# Patient Record
Sex: Female | Born: 1995 | Race: White | Hispanic: No | Smoking: Former smoker
Health system: Southern US, Community
[De-identification: ages and names within clinical notes are randomized; demographics above are authoritative.]

## PROBLEM LIST (undated history)

## (undated) ENCOUNTER — Inpatient Hospital Stay: Payer: Self-pay

## (undated) ENCOUNTER — Ambulatory Visit: Admission: EM | Payer: Medicaid Other

## (undated) DIAGNOSIS — A498 Other bacterial infections of unspecified site: Secondary | ICD-10-CM

## (undated) DIAGNOSIS — D509 Iron deficiency anemia, unspecified: Secondary | ICD-10-CM

## (undated) DIAGNOSIS — O351XX0 Maternal care for (suspected) chromosomal abnormality in fetus, not applicable or unspecified: Secondary | ICD-10-CM

## (undated) DIAGNOSIS — E669 Obesity, unspecified: Secondary | ICD-10-CM

## (undated) DIAGNOSIS — F419 Anxiety disorder, unspecified: Secondary | ICD-10-CM

## (undated) DIAGNOSIS — S32599A Other specified fracture of unspecified pubis, initial encounter for closed fracture: Secondary | ICD-10-CM

## (undated) DIAGNOSIS — R51 Headache: Secondary | ICD-10-CM

## (undated) DIAGNOSIS — O2300 Infections of kidney in pregnancy, unspecified trimester: Secondary | ICD-10-CM

## (undated) DIAGNOSIS — R519 Headache, unspecified: Secondary | ICD-10-CM

## (undated) DIAGNOSIS — N946 Dysmenorrhea, unspecified: Secondary | ICD-10-CM

## (undated) DIAGNOSIS — N390 Urinary tract infection, site not specified: Secondary | ICD-10-CM

## (undated) HISTORY — DX: Dysmenorrhea, unspecified: N94.6

## (undated) HISTORY — DX: Other specified fracture of unspecified pubis, initial encounter for closed fracture: S32.599A

## (undated) HISTORY — DX: Headache: R51

## (undated) HISTORY — DX: Anxiety disorder, unspecified: F41.9

## (undated) HISTORY — PX: WISDOM TOOTH EXTRACTION: SHX21

## (undated) HISTORY — DX: Infections of kidney in pregnancy, unspecified trimester: O23.00

## (undated) HISTORY — DX: Maternal care for (suspected) chromosomal abnormality in fetus, not applicable or unspecified: O35.1XX0

## (undated) HISTORY — DX: Headache, unspecified: R51.9

---
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
## 2017-01-21 NOTE — Telephone Encounter (Signed)
[facility]

## 2017-12-03 ENCOUNTER — Encounter: Payer: Self-pay | Admitting: Obstetrics and Gynecology

## 2017-12-03 ENCOUNTER — Ambulatory Visit (INDEPENDENT_AMBULATORY_CARE_PROVIDER_SITE_OTHER): Payer: Medicaid Other | Admitting: Obstetrics and Gynecology

## 2017-12-03 VITALS — BP 118/78 | HR 89 | Ht 66.0 in | Wt 240.0 lb

## 2017-12-03 DIAGNOSIS — Z3202 Encounter for pregnancy test, result negative: Secondary | ICD-10-CM | POA: Diagnosis not present

## 2017-12-03 DIAGNOSIS — Z30432 Encounter for removal of intrauterine contraceptive device: Secondary | ICD-10-CM | POA: Diagnosis not present

## 2017-12-03 DIAGNOSIS — N912 Amenorrhea, unspecified: Secondary | ICD-10-CM | POA: Diagnosis not present

## 2017-12-03 DIAGNOSIS — T8332XA Displacement of intrauterine contraceptive device, initial encounter: Secondary | ICD-10-CM | POA: Diagnosis not present

## 2017-12-03 LAB — POCT URINE PREGNANCY: Preg Test, Ur: NEGATIVE

## 2017-12-04 NOTE — Progress Notes (Signed)
Obstetrics & Gynecology Office Visit   Chief Complaint:  Chief Complaint  Patient presents with  . IUD check    spotty bleeding/can't feel IUD strings  . Amenorrhea    requesting pregnancy test    History of Present Illness: 22 y.o. patient presenting for follow up of Mirena IUD placement 3 years ago ago.  The indication for her IUD was contraception.  She reports regular monthly menses despite IUD.  She assumed the IUD was expelled but denies cramping or heavy bleeding.  The IUD was evaluated 1 year ago by [facility] and noted to be in proper position and deployment within the endometrial  She is not able to feel strings.   Review of Systems: Review of Systems  Constitutional: Negative for chills and fever.  HENT: Negative for congestion.   Respiratory: Negative for cough and shortness of breath.   Cardiovascular: Negative for chest pain and palpitations.  Gastrointestinal: Negative for abdominal pain, constipation, diarrhea, heartburn, nausea and vomiting.  Genitourinary: Negative for dysuria, frequency and urgency.  Skin: Negative for itching and rash.  Neurological: Negative for dizziness and headaches.  Endo/Heme/Allergies: Negative for polydipsia.  Psychiatric/Behavioral: Negative for depression.    Past Medical History:  Past Medical History:  Diagnosis Date  . E coli infection 06/16/2014  . Iron deficiency anemia   . Obesity   . Urinary tract infection     Past Surgical History:  Past Surgical History:  Procedure Laterality Date  . WISDOM TOOTH EXTRACTION      Gynecologic History: No LMP recorded. (Menstrual status: IUD).  Obstetric History: G2P1101  Family History:  Family History  Problem Relation Age of Onset  . Diabetes Mother   . Hypertension Mother     Social History:  Social History   Socioeconomic History  . Marital status: Single    Spouse name: Not on file  . Number of children: Not on file  . Years of education: Not on file  . Highest  education level: Not on file  Occupational History  . Not on file  Social Needs  . Financial resource strain: Not on file  . Food insecurity:    Worry: Not on file    Inability: Not on file  . Transportation needs:    Medical: Not on file    Non-medical: Not on file  Tobacco Use  . Smoking status: Never Smoker  . Smokeless tobacco: Never Used  Substance and Sexual Activity  . Alcohol use: No  . Drug use: No  . Sexual activity: Yes    Birth control/protection: IUD  Lifestyle  . Physical activity:    Days per week: Not on file    Minutes per session: Not on file  . Stress: Not on file  Relationships  . Social connections:    Talks on phone: Not on file    Gets together: Not on file    Attends religious service: Not on file    Active member of club or organization: Not on file    Attends meetings of clubs or organizations: Not on file    Relationship status: Not on file  . Intimate partner violence:    Fear of current or ex partner: Not on file    Emotionally abused: Not on file    Physically abused: Not on file    Forced sexual activity: Not on file  Other Topics Concern  . Not on file  Social History Narrative  . Not on file  Allergies:  No Known Allergies  Medications: Prior to Admission medications   Medication Sig Start Date End Date Taking? Authorizing Provider  levonorgestrel (MIRENA) 20 MCG/24HR IUD 1 each by Intrauterine route once.   Yes [provider]  valACYclovir (VALTREX) 1000 MG tablet Take by mouth.   Yes [provider]  busPIRone (BUSPAR) 7.5 MG tablet Take by mouth. 06/27/17 06/27/18  [provider]    Physical Exam Blood pressure 118/78, pulse 89, height 5\' 6"  (1.676 m), weight 240 lb (108.9 kg), unknown if currently breastfeeding. No LMP recorded. (Menstrual status: IUD).  General: NAD HEENT: normocephalic, anicteric Pulmonary: No increased work of breathing  Genitourinary:  External: Normal external female  genitalia.  Normal urethral meatus, normal  Bartholin's and Skene's glands.    Vagina: Normal vaginal mucosa, no evidence of prolapse.    Cervix: Grossly normal in appearance, no bleeding, IUD strings not visualized .    Uterus: Non-enlarged, mobile, normal contour.  No CMT  Adnexa: ovaries non-enlarged, no adnexal masses  Rectal: deferred  Lymphatic: no evidence of inguinal lymphadenopathy Extremities: no edema, erythema, or tenderness Neurologic: Grossly intact Psychiatric: mood appropriate, affect full  Female chaperone present for pelvic exam   GYNECOLOGY OFFICE PROCEDURE NOTE  Jemina Minna MerrittsRenee Fee is a 22 y.o. [id number] here for Mirena IUD removal placed 2016. She desires removal secondary to attempting to conceive.  IUD Removal  Patient identified, informed consent performed, consent signed.  Patient was in the dorsal lithotomy position, normal external genitalia was noted.  A speculum was placed in the patient's vagina, normal discharge was noted, no lesions. The cervix was visualized, no lesions, no abnormal discharge.  The strings of the IUD were not visualized.  Betadine was used to clean the cervix.   Attempts at locating the the IUD string intracervically using a pap brush to sweep the strings into view and Kelly forceps were unsucessfull.  Patient did not tolerated the procedure well and further attempts were aborted.    Assessment: 22 y.o. [id number] presenting over concerns of IUD migration.  Plan: Problem List Items Addressed This Visit    None    Visit Diagnoses    Amenorrhea    -  Primary   Relevant Orders   POCT urine pregnancy (Completed)   Intrauterine contraceptive device threads lost, initial encounter       Relevant Orders   US Transvaginal Non-OB       1.  IUD strings not visualized - repeat ultrasound to visualized IUD  - if present we discussed use of IUD hook and possible ultrasound guidance to facilitate the procedure but patient would like to have  procedure done under anesthesia given degree of discomfort experienced today - if IUD is not visualized on ultrasound plain film abdomen to confirm expulsion rather than perforation.  2) Return in about 1 week (around 12/10/2017) for GYN US IUD location.   Vena AustriaAndreas Vishaal Strollo, MD, Merlinda FrederickFACOG [facility], [facility] 12/04/2017, 5:17 PM

## 2017-12-08 ENCOUNTER — Other Ambulatory Visit: Payer: Medicaid Other

## 2017-12-08 ENCOUNTER — Ambulatory Visit: Payer: Medicaid Other | Admitting: Obstetrics and Gynecology

## 2017-12-09 ENCOUNTER — Telehealth: Payer: Self-pay

## 2017-12-09 NOTE — Telephone Encounter (Signed)
Pt believes she rcvd a call from our office last week. She is uncertain what it was about. Cb#[phone]

## 2017-12-09 NOTE — Telephone Encounter (Signed)
Ultrasound would have been it for follow up

## 2017-12-10 NOTE — Telephone Encounter (Signed)
Patient is schedule 12/18/17

## 2017-12-18 ENCOUNTER — Other Ambulatory Visit: Payer: Medicaid Other

## 2017-12-18 ENCOUNTER — Ambulatory Visit: Payer: Medicaid Other | Admitting: Obstetrics and Gynecology

## 2018-03-25 ENCOUNTER — Ambulatory Visit (INDEPENDENT_AMBULATORY_CARE_PROVIDER_SITE_OTHER): Payer: Medicaid Other | Admitting: Certified Nurse Midwife

## 2018-03-25 ENCOUNTER — Other Ambulatory Visit: Payer: Self-pay | Admitting: Certified Nurse Midwife

## 2018-03-25 ENCOUNTER — Encounter: Payer: Self-pay | Admitting: Certified Nurse Midwife

## 2018-03-25 ENCOUNTER — Other Ambulatory Visit (HOSPITAL_COMMUNITY)
Admission: RE | Admit: 2018-03-25 | Discharge: 2018-03-25 | Disposition: A | Discharge status: Home / Self Care | Payer: Medicaid Other | Source: Ambulatory Visit | Attending: Certified Nurse Midwife | Admitting: Certified Nurse Midwife

## 2018-03-25 VITALS — BP 110/70 | Ht 67.0 in | Wt 236.0 lb

## 2018-03-25 DIAGNOSIS — O99210 Obesity complicating pregnancy, unspecified trimester: Secondary | ICD-10-CM

## 2018-03-25 DIAGNOSIS — Z1389 Encounter for screening for other disorder: Secondary | ICD-10-CM

## 2018-03-25 DIAGNOSIS — Z113 Encounter for screening for infections with a predominantly sexual mode of transmission: Secondary | ICD-10-CM

## 2018-03-25 DIAGNOSIS — O0990 Supervision of high risk pregnancy, unspecified, unspecified trimester: Secondary | ICD-10-CM

## 2018-03-25 DIAGNOSIS — Z3A01 Less than 8 weeks gestation of pregnancy: Secondary | ICD-10-CM | POA: Diagnosis not present

## 2018-03-25 DIAGNOSIS — O99211 Obesity complicating pregnancy, first trimester: Secondary | ICD-10-CM | POA: Diagnosis not present

## 2018-03-25 DIAGNOSIS — F419 Anxiety disorder, unspecified: Secondary | ICD-10-CM

## 2018-03-25 DIAGNOSIS — O99331 Smoking (tobacco) complicating pregnancy, first trimester: Secondary | ICD-10-CM

## 2018-03-25 DIAGNOSIS — O09299 Supervision of pregnancy with other poor reproductive or obstetric history, unspecified trimester: Secondary | ICD-10-CM

## 2018-03-25 DIAGNOSIS — IMO0001 Reserved for inherently not codable concepts without codable children: Secondary | ICD-10-CM

## 2018-03-25 DIAGNOSIS — Z720 Tobacco use: Secondary | ICD-10-CM

## 2018-03-25 DIAGNOSIS — F418 Other specified anxiety disorders: Secondary | ICD-10-CM

## 2018-03-25 DIAGNOSIS — N926 Irregular menstruation, unspecified: Secondary | ICD-10-CM

## 2018-03-25 DIAGNOSIS — Z8759 Personal history of other complications of pregnancy, childbirth and the puerperium: Secondary | ICD-10-CM

## 2018-03-25 DIAGNOSIS — E6609 Other obesity due to excess calories: Secondary | ICD-10-CM

## 2018-03-25 DIAGNOSIS — Z331 Pregnant state, incidental: Secondary | ICD-10-CM

## 2018-03-25 DIAGNOSIS — O351XX0 Maternal care for (suspected) chromosomal abnormality in fetus, not applicable or unspecified: Secondary | ICD-10-CM

## 2018-03-25 DIAGNOSIS — O09291 Supervision of pregnancy with other poor reproductive or obstetric history, first trimester: Secondary | ICD-10-CM

## 2018-03-25 DIAGNOSIS — Z6836 Body mass index (BMI) 36.0-36.9, adult: Secondary | ICD-10-CM

## 2018-03-25 LAB — POCT URINALYSIS DIPSTICK OB
Glucose, UA: NEGATIVE
POC,PROTEIN,UA: NEGATIVE
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
## 2018-03-26 ENCOUNTER — Encounter: Payer: Self-pay | Admitting: Certified Nurse Midwife

## 2018-03-26 LAB — COMPREHENSIVE METABOLIC PANEL
ALK PHOS: 67 IU/L (ref 39–117)
ALT: 27 IU/L (ref 0–32)
AST: 24 IU/L (ref 0–40)
Albumin/Globulin Ratio: 1.9 (ref 1.2–2.2)
Albumin: 4.3 g/dL (ref 3.5–5.5)
BUN/Creatinine Ratio: 7 — ABNORMAL LOW (ref 9–23)
BUN: 4 mg/dL — AB (ref 6–20)
Bilirubin Total: 0.4 mg/dL (ref 0.0–1.2)
CHLORIDE: 101 mmol/L (ref 96–106)
CO2: 19 mmol/L — ABNORMAL LOW (ref 20–29)
CREATININE: 0.59 mg/dL (ref 0.57–1.00)
Calcium: 9.1 mg/dL (ref 8.7–10.2)
GFR calc Af Amer: 153 mL/min/{1.73_m2} (ref >59)
GFR calc non Af Amer: 132 mL/min/{1.73_m2} (ref >59)
GLOBULIN, TOTAL: 2.3 g/dL (ref 1.5–4.5)
GLUCOSE: 84 mg/dL (ref 65–99)
Potassium: 3.9 mmol/L (ref 3.5–5.2)
SODIUM: 136 mmol/L (ref 134–144)
Total Protein: 6.6 g/dL (ref 6.0–8.5)

## 2018-03-26 LAB — PROTEIN / CREATININE RATIO, URINE
Creatinine, Urine: 231.7 mg/dL
PROTEIN UR: 17.3 mg/dL
PROTEIN/CREAT RATIO: 75 mg/g{creat} (ref 0–200)

## 2018-03-26 LAB — RPR+RH+ABO+RUB AB+AB SCR+CB...
ANTIBODY SCREEN: NEGATIVE
HEMATOCRIT: 37.1 % (ref 34.0–46.6)
HIV Screen 4th Generation wRfx: NONREACTIVE
Hemoglobin: 12.2 g/dL (ref 11.1–15.9)
Hepatitis B Surface Ag: NEGATIVE
MCH: 27.1 pg (ref 26.6–33.0)
MCHC: 32.9 g/dL (ref 31.5–35.7)
MCV: 82 fL (ref 79–97)
Platelets: 300 10*3/uL (ref 150–450)
RBC: 4.51 x10E6/uL (ref 3.77–5.28)
RDW: 13.3 % (ref 12.3–15.4)
RH TYPE: NEGATIVE
RPR Ser Ql: NONREACTIVE
Rubella Antibodies, IGG: <0.9 index — ABNORMAL LOW (ref >0.99)
Varicella zoster IgG: <135 index — ABNORMAL LOW (ref >165)
WBC: 7.6 10*3/uL (ref 3.4–10.8)

## 2018-03-26 LAB — CERVICOVAGINAL ANCILLARY ONLY
CHLAMYDIA, DNA PROBE: NEGATIVE
NEISSERIA GONORRHEA: NEGATIVE
Trichomonas: NEGATIVE

## 2018-03-27 ENCOUNTER — Encounter: Payer: Self-pay | Admitting: Certified Nurse Midwife

## 2018-03-27 DIAGNOSIS — Z720 Tobacco use: Secondary | ICD-10-CM | POA: Insufficient documentation

## 2018-03-27 DIAGNOSIS — Z8759 Personal history of other complications of pregnancy, childbirth and the puerperium: Secondary | ICD-10-CM | POA: Insufficient documentation

## 2018-03-27 DIAGNOSIS — O0990 Supervision of high risk pregnancy, unspecified, unspecified trimester: Secondary | ICD-10-CM | POA: Insufficient documentation

## 2018-03-27 DIAGNOSIS — S32599A Other specified fracture of unspecified pubis, initial encounter for closed fracture: Secondary | ICD-10-CM | POA: Insufficient documentation

## 2018-03-27 DIAGNOSIS — F419 Anxiety disorder, unspecified: Secondary | ICD-10-CM | POA: Insufficient documentation

## 2018-03-27 DIAGNOSIS — E669 Obesity, unspecified: Secondary | ICD-10-CM | POA: Insufficient documentation

## 2018-03-27 DIAGNOSIS — N926 Irregular menstruation, unspecified: Secondary | ICD-10-CM | POA: Insufficient documentation

## 2018-03-28 LAB — URINE CULTURE

## 2018-03-30 LAB — URINE DRUG PANEL 7
Amphetamines, Urine: NEGATIVE ng/mL
Barbiturate Quant, Ur: NEGATIVE ng/mL
Benzodiazepine Quant, Ur: NEGATIVE ng/mL
Cannabinoid Quant, Ur: POSITIVE — AB
Cocaine (Metab.): NEGATIVE ng/mL
OPIATE QUANT UR: NEGATIVE ng/mL
PCP QUANT UR: NEGATIVE ng/mL

## 2018-03-31 ENCOUNTER — Encounter: Payer: Self-pay | Admitting: Certified Nurse Midwife

## 2018-04-06 ENCOUNTER — Encounter: Payer: Self-pay | Admitting: Obstetrics and Gynecology

## 2018-04-06 ENCOUNTER — Other Ambulatory Visit: Payer: Self-pay

## 2018-04-07 ENCOUNTER — Ambulatory Visit (INDEPENDENT_AMBULATORY_CARE_PROVIDER_SITE_OTHER): Payer: Medicaid Other | Admitting: Advanced Practice Midwife

## 2018-04-07 ENCOUNTER — Ambulatory Visit (INDEPENDENT_AMBULATORY_CARE_PROVIDER_SITE_OTHER): Payer: Self-pay

## 2018-04-07 ENCOUNTER — Other Ambulatory Visit: Payer: Self-pay | Admitting: Certified Nurse Midwife

## 2018-04-07 ENCOUNTER — Other Ambulatory Visit: Payer: Self-pay

## 2018-04-07 ENCOUNTER — Encounter: Payer: Self-pay | Admitting: Advanced Practice Midwife

## 2018-04-07 VITALS — BP 120/80 | Wt 238.0 lb

## 2018-04-07 DIAGNOSIS — O09291 Supervision of pregnancy with other poor reproductive or obstetric history, first trimester: Secondary | ICD-10-CM | POA: Diagnosis not present

## 2018-04-07 DIAGNOSIS — O3680X0 Pregnancy with inconclusive fetal viability, not applicable or unspecified: Secondary | ICD-10-CM

## 2018-04-07 DIAGNOSIS — N926 Irregular menstruation, unspecified: Secondary | ICD-10-CM

## 2018-04-07 DIAGNOSIS — Z3A08 8 weeks gestation of pregnancy: Secondary | ICD-10-CM | POA: Diagnosis not present

## 2018-04-07 DIAGNOSIS — Z3A01 Less than 8 weeks gestation of pregnancy: Secondary | ICD-10-CM

## 2018-04-07 DIAGNOSIS — O0990 Supervision of high risk pregnancy, unspecified, unspecified trimester: Secondary | ICD-10-CM

## 2018-04-07 NOTE — Progress Notes (Signed)
  Routine Prenatal Care Visit  Subjective  Misty Collins is a 22 y.o. G3P1101 at 2626w3d being seen today for ongoing prenatal care.  She is currently monitored for the following issues for this high-risk pregnancy and has Anxiety; Closed fracture of pubic ramus (HCC); Fetus with chromosomal abnormality; Obesity; History of IUFD; Supervision of high risk pregnancy, antepartum; Irregular menses; and Tobacco use on their problem list.  ----------------------------------------------------------------------------------- Patient reports breast tenderness.  Reviewed ultrasound report. Will follow up in 2 weeks for viability. Early glucola today.  . Vag. Bleeding: None.   . Denies leaking of fluid.  ----------------------------------------------------------------------------------- The following portions of the patient's history were reviewed and updated as appropriate: allergies, current medications, past family history, past medical history, past social history, past surgical history and problem list. Problem list updated.   Objective  Blood pressure 120/80, weight 238 lb (108 kg), last menstrual period 02/07/2018 Pregravid weight 240 lb (108.9 kg) Total Weight Gain -2 lb (-0.907 kg) Urinalysis: Urine Protein    Urine Glucose    Fetal Status:            Patient Name: Misty Collins DOB: Oct 26, 1995 MRN: [id number] ULTRASOUND REPORT  Location: [facility] Date of Service: 04/07/2018   Indications:dating, lost IUD Findings:  Mason JimSingleton intrauterine pregnancy is visualized with a CRL consistent with 3964w6d gestation, giving an (U/S) EDD of 12/02/2018. The (U/S) EDD is not consistent with the clinically established EDD of 11/14/2018.  FHR: 0 BPM- No cardiac activity seen on today's ultrasound. Questionable due to small fetal pole vs other. CRL measurement: 2.7 mm (4-945mm cardiac activity should be present) Gestational sac appears slightly low in position. Yolk sac is visualized and  appears normal and early anatomy is normal. Amnion: visualized and appears normal   Right Ovary is normal in appearance. Left Ovary is normal appearance. Survey of the adnexa demonstrates no adnexal masses. There is no free peritoneal fluid in the cul de sac. No IUD seen.  Impression: 1. 7064w6d Singleton Intrauterine pregnancy by U/S with no visible cardiac activity yet. 2. (U/S) EDD is not consistent with Clinically established EDD of 11/14/2018. US EDD of 12/02/2018. 3. Follow up ultrasound for cardiac activity and gestational sac position. 4. No IUD seen.  Recommendations: 1.Clinical correlation with the patient's History and Physical Exam  ___________________________________________________________________________________________________________________  General:  Alert, oriented and cooperative. Patient is in no acute distress.  Skin: Skin is warm and dry. No rash noted.   Cardiovascular: Normal heart rate noted  Respiratory: Normal respiratory effort, no problems with respiration noted  Abdomen: Soft, gravid, appropriate for gestational age. Pain/Pressure: Absent     Pelvic:  Cervical exam deferred        Extremities: Normal range of motion.     Mental Status: Normal mood and affect. Normal behavior. Normal judgment and thought content.   Assessment   22 y.o. [id number] at 6726w3d by  11/14/2018, by Last Menstrual Period presenting for routine prenatal visit  Plan   THIRD Problems (from 03/25/18 to present)    No problems associated with this episode.       Preterm labor symptoms and general obstetric precautions including but not limited to vaginal bleeding, contractions, leaking of fluid and fetal movement were reviewed in detail with the patient.   Return in about 2 weeks (around 04/21/2018) for viability scan and rob.  Tresea MallJane Ryane Konieczny, CNM 04/07/2018 11:58 AM

## 2018-04-08 LAB — GLUCOSE, 1 HOUR GESTATIONAL: Gestational Diabetes Screen: 118 mg/dL (ref 65–139)

## 2018-04-14 ENCOUNTER — Other Ambulatory Visit: Payer: Self-pay

## 2018-04-14 ENCOUNTER — Telehealth: Payer: Self-pay

## 2018-04-14 ENCOUNTER — Encounter: Payer: Self-pay | Admitting: Emergency Medicine

## 2018-04-14 ENCOUNTER — Emergency Department
Admission: EM | Admit: 2018-04-14 | Discharge: 2018-04-14 | Disposition: A | Discharge status: Home / Self Care | Payer: Medicaid Other | Attending: Emergency Medicine | Admitting: Emergency Medicine

## 2018-04-14 ENCOUNTER — Emergency Department: Payer: Medicaid Other

## 2018-04-14 DIAGNOSIS — O200 Threatened abortion: Secondary | ICD-10-CM | POA: Diagnosis not present

## 2018-04-14 DIAGNOSIS — Z87891 Personal history of nicotine dependence: Secondary | ICD-10-CM | POA: Insufficient documentation

## 2018-04-14 DIAGNOSIS — R102 Pelvic and perineal pain: Secondary | ICD-10-CM | POA: Insufficient documentation

## 2018-04-14 DIAGNOSIS — O209 Hemorrhage in early pregnancy, unspecified: Secondary | ICD-10-CM | POA: Diagnosis present

## 2018-04-14 LAB — CBC
HEMATOCRIT: 39.4 % (ref 36.0–46.0)
HEMOGLOBIN: 12.8 g/dL (ref 12.0–15.0)
MCH: 26.6 pg (ref 26.0–34.0)
MCHC: 32.5 g/dL (ref 30.0–36.0)
MCV: 81.9 fL (ref 80.0–100.0)
NRBC: 0 % (ref 0.0–0.2)
PLATELETS: 327 10*3/uL (ref 150–400)
RBC: 4.81 MIL/uL (ref 3.87–5.11)
RDW: 12.8 % (ref 11.5–15.5)
WBC: 6.2 10*3/uL (ref 4.0–10.5)

## 2018-04-14 LAB — HCG, QUANTITATIVE, PREGNANCY: HCG, BETA CHAIN, QUANT, S: 11181 m[IU]/mL — AB (ref <5)

## 2018-04-14 LAB — ANTIBODY SCREEN: Antibody Screen: NEGATIVE

## 2018-04-14 LAB — ABO/RH: ABO/RH(D): B NEG

## 2018-04-14 MED ORDER — RHO D IMMUNE GLOBULIN 1500 UNIT/2ML IJ SOSY
300.0000 ug | PREFILLED_SYRINGE | Freq: Once | INTRAMUSCULAR | Status: AC
  Administered 2018-04-14: 300 ug via INTRAMUSCULAR
  Filled 2018-04-14: qty 2

## 2018-04-14 NOTE — ED Triage Notes (Signed)
Says [redacted] week pregnant and has clots this am and cramping all day.

## 2018-04-14 NOTE — Telephone Encounter (Signed)
Pt is having really bad cramping today; a lot of blood comes out when she goes to bathroom - some stringy and some clots.  States she is probably going to go to the hospital since we don't have any openings. [phone]  Left detailed msg that she should go to ED especially if in that much pain.

## 2018-04-14 NOTE — ED Notes (Signed)
Pt states she is approximately [redacted] weeks pregnant, states she has had two ultrasounds and states the last one was not really showing any changes, no heart beat , states they told her it still may be really early in the pregnancy. Pt states today she began having vaginal bleeding, passing clots. Pt states this is her 3rd pregnancy, first still born at 2734 weeks and she has a living 20 year old.

## 2018-04-14 NOTE — ED Notes (Signed)
E signature pad not working in room. Pt states understanding of discharge and follow up, no further questions at this time.

## 2018-04-14 NOTE — ED Provider Notes (Signed)
[facility] Emergency Department Provider Note  Time seen: 4:04 PM  I have reviewed the triage vital signs and the nursing notes.   HISTORY  Chief Complaint Vaginal Bleeding    HPI Misty Collins is a 22 y.o. female G3 P1 A1 at approximately 8 to [redacted] weeks pregnant who presents to the emergency department for vaginal bleeding.  According to the patient she first had an ultrasound performed several weeks ago showing she was approximately [redacted] weeks pregnant.  States 2 weeks ago she had a repeat ultrasound performed essentially showing an unchanged pregnancy.  Was told to follow-up on 04/21/2018 for recheck however today the patient began with vaginal bleeding with some clot production and moderate lower abdominal cramping.   Past Medical History:  Diagnosis Date  . Anxiety   . Closed fracture of pubic ramus (HCC)    2016  . Dysmenorrhea   . E coli infection 06/16/2014  . 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 Headache   . Iron deficiency anemia   . Obesity   . Pyelonephritis affecting pregnancy 12/2011  . Urinary tract infection     Patient Active Problem List   Diagnosis Date Noted  . History of IUFD 03/27/2018  . Supervision of high risk pregnancy, antepartum 03/27/2018  . Irregular menses 03/27/2018  . Tobacco use 03/27/2018  . Anxiety   . Closed fracture of pubic ramus (HCC)   . Obesity   . 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 WISDOM TOOTH EXTRACTION      Prior to Admission medications   Not on File    Allergies  Allergen Reactions  . Other     Can not eat red meat    Family History  Problem Relation Age of Onset  . Diabetes Mother   . Hypertension Mother   . Rheum arthritis Mother   . Hypothyroidism Mother        Hashimoto's  . Obesity Mother   . Diabetes Maternal Grandmother   . Heart disease Maternal Grandmother   . Cancer Maternal  Grandfather        LUNG  . Diabetes Paternal Grandmother   . Heart disease Paternal Grandfather   . Chromosomal disorder Son        Trisomy 18/ oomphalacele/ stillborn    Social History Social History   Tobacco Use  . Smoking status: Former Games developermoker  . Smokeless tobacco: Never Used  Substance Use Topics  . Alcohol use: No  . Drug use: No    Review of Systems Constitutional: Negative for fever Cardiovascular: Negative for chest pain. Respiratory: Negative for shortness of breath. Gastrointestinal: Lower abdominal cramping, moderate, intermittent.  None currently. Genitourinary: Negative for urinary compaints.  Positive vaginal bleeding. Musculoskeletal: Negative for musculoskeletal complaints Skin: Negative for skin complaints  Neurological: Negative for headache All other ROS negative  ____________________________________________   PHYSICAL EXAM:  VITAL SIGNS: ED Triage Vitals [04/14/18 1510]  Enc Vitals Group     BP 122/73     Pulse Rate 85     Resp 16     Temp 98.2 F (36.8 C)     Temp Source Oral     SpO2 99 %     Weight 237 lb 14 oz (107.9 kg)     Height 5\' 7"  (1.702 m)     Head Circumference      Peak Flow  Pain Score 9     Pain Loc      Pain Edu?      Excl. in GC?    Constitutional: Alert and oriented. Well appearing and in no distress. Eyes: Normal exam ENT   Head: Normocephalic and atraumatic.   Mouth/Throat: Mucous membranes are moist. Cardiovascular: Normal rate, regular rhythm. Respiratory: Normal respiratory effort without tachypnea nor retractions. Breath sounds are clear  Gastrointestinal: Soft, mild suprapubic tenderness palpation no rebound guarding or distention. Musculoskeletal: Nontender with normal range of motion in all extremities. No lower extremity tenderness or edema. Neurologic:  Normal speech and language. No gross focal neurologic deficits  Psychiatric: Mood and affect are normal. Speech and behavior are normal.    ____________________________________________   RADIOLOGY  Ultrasound shows a 6-week 2-day fetus with no cardiac activity  ____________________________________________   INITIAL IMPRESSION / ASSESSMENT AND PLAN / ED COURSE  Pertinent labs & imaging results that were available during my care of the patient were reviewed by me and considered in my medical decision making (see chart for details).  Patient presents to the emergency department for vaginal bleeding.  Given the patient's story of a non-progressing pregnancy and ultrasound now with vaginal bleeding today the concern of course would be for a miscarriage.  We will check labs including beta hCG and obtain a repeat ultrasound.  I reviewed the patient's records she has a B- blood type we will order RhoGam for the patient.  Patient agreeable to plan of care.  In reviewing the patient's records on 04/07/2018 patient had an ultrasound showing 5 weeks 6 days with no cardiac activity.  We will compare to today's ultrasound once available.  Ultrasound shows a 6-week 2-day fetus with no cardiac activity.  This is essentially several days of growth over a 1 week.,  Suggesting likely miscarriage although not definitive.  Patient has a B- blood type we will dose RhoGam.  I discussed with the patient using ibuprofen for her cramping and the likelihood of a miscarriage.  Patient has follow-up 04/21/2018 with OB which I recommended she follow-up at that time.  Also discussed return precautions for any weakness, dizziness, or worsening pain.  Patient's H&H is normal.  ____________________________________________   FINAL CLINICAL IMPRESSION(S) / ED DIAGNOSES  Threatened miscarriage    Minna Antis, MD 04/14/18 1746

## 2018-04-15 ENCOUNTER — Telehealth: Payer: Self-pay

## 2018-04-15 LAB — RHOGAM INJECTION: Unit division: 0

## 2018-04-15 NOTE — Telephone Encounter (Signed)
Pt was seen in ED yesterday; they did blood work and u/s; they said it wasn't looking good.  Needs number from last quant we did so ED can compare.  [phone]

## 2018-04-15 NOTE — Telephone Encounter (Signed)
Spoke with patient regarding plan of care. She is still having bleeding with cramping. She will come to appointment on Tuesday.

## 2018-04-21 ENCOUNTER — Other Ambulatory Visit: Payer: Self-pay

## 2018-04-21 ENCOUNTER — Encounter: Payer: Self-pay | Admitting: Maternal Newborn

## 2018-04-27 ENCOUNTER — Ambulatory Visit (INDEPENDENT_AMBULATORY_CARE_PROVIDER_SITE_OTHER): Payer: Medicaid Other

## 2018-04-27 ENCOUNTER — Ambulatory Visit (INDEPENDENT_AMBULATORY_CARE_PROVIDER_SITE_OTHER): Payer: Medicaid Other | Admitting: Advanced Practice Midwife

## 2018-04-27 ENCOUNTER — Encounter: Payer: Self-pay | Admitting: Advanced Practice Midwife

## 2018-04-27 ENCOUNTER — Telehealth: Payer: Self-pay | Admitting: Obstetrics and Gynecology

## 2018-04-27 VITALS — BP 118/76 | Wt 232.0 lb

## 2018-04-27 DIAGNOSIS — O021 Missed abortion: Secondary | ICD-10-CM

## 2018-04-27 NOTE — Telephone Encounter (Signed)
-----   Message from Tresea MallJane Gledhill, CNM sent at 04/27/2018  4:38 PM EST ----- Regarding: D&C Surgery Booking Request Patient Full Name:  Misty DickinsonChelsey Renee Collins  MRN: [id number]  DOB: April 09, 1996  Surgeon: Jerene PitchSchuman Requested Surgery Date and Time: 04/28/2018 Primary Diagnosis AND Code: retained products, missed abortion Secondary Diagnosis and Code:  Surgical Procedure: D&C L&D Notification: No Admission Status: same day surgery Length of Surgery: 1 hour Special Case Needs:  H&P:  (date) Phone Interview???:  Interpreter: Language:  Medical Clearance:  Special Scheduling Instructions:

## 2018-04-27 NOTE — Progress Notes (Signed)
Follow Up u/s

## 2018-04-27 NOTE — Telephone Encounter (Signed)
Patient is aware to [facility] desk at 12:30pm, and surgery at 2:30pm. Patient is aware she must be NPO after midnight. Patient said she does not take medications, but then asked about taking hydrocodone and percocet for her hip, and said she knew not to take anything prior to surgery but wants to know what is okay to take after surgery. Patient is aware she should discuss this with Dr Jerene PitchSchuman tomorrow.

## 2018-04-27 NOTE — Progress Notes (Signed)
  Routine Prenatal Care Visit  Subjective  Misty Collins is a 22 y.o. G3P1101 at 1234w2d being seen today for ongoing prenatal care.  She is currently monitored for the following issues for this high-risk pregnancy and has Anxiety; Closed fracture of pubic ramus (HCC); Fetus with chromosomal abnormality; Obesity; History of IUFD; Supervision of high risk pregnancy, antepartum; Irregular menses; and Tobacco use on their problem list.  ----------------------------------------------------------------------------------- Patient reports some cramping and continued dark spotting. She reports no heavy bleeding for the past week. Discussed with patient that she may need to follow up with a D&C based on the ultrasound results. I called her after talking with both Drs Jerene PitchSchuman and Bonney AidStaebler and they agreed that it would be best to do the D&C. Dr Jerene PitchSchuman is able to do the procedure tomorrow. Patient is agreeable to having the procedure tomorrow and Harriett Sineancy will call to schedule and call the patient to discuss the details. Dr Jerene PitchSchuman will do the H&P with the patient tomorrow.    .  .   . Denies leaking of fluid.  ----------------------------------------------------------------------------------- The following portions of the patient's history were reviewed and updated as appropriate: allergies, current medications, past family history, past medical history, past social history, past surgical history and problem list. Problem list updated.   Objective  Blood pressure 118/76, weight 232 lb (105.2 kg), last menstrual period 02/07/2018 Pregravid weight 240 lb (108.9 kg) Total Weight Gain -8 lb (-3.629 kg) Urinalysis: Urine Protein    Urine Glucose    Fetal Status:           Follow up ultrasound today  Patient Name: Misty Collins DOB: 09/07/1995 MRN: [id number] ULTRASOUND REPORT  Location: [facility] Date of Service: 04/27/2018   Indications: Missed AB Findings:  Irregular shaped gestational sac  seen with echogenic material. No fetal pole/yolk sac seen.  Right Ovary is normal in appearance. Left Ovary is normal appearance. Survey of the adnexa demonstrates no adnexal masses. There is no free peritoneal fluid in the cul de sac.  Impression: 1. Retained products.  Recommendations: 1.Clinical correlation with the patient's History and Physical Exam.  Darlina GuysAbby M Clarke, RDMS RVT   Missed abortion based on persistent gestational sac.  Fetal pole not identified in today's study.  Options include expectant, medical, or surgical management.  General:  Alert, oriented and cooperative. Patient is in no acute distress.  Skin: Skin is warm and dry. No rash noted.   Cardiovascular: Normal heart rate noted  Respiratory: Normal respiratory effort, no problems with respiration noted  Abdomen: Soft, gravid, appropriate for gestational age.       Pelvic:  Cervical exam deferred        Extremities: Normal range of motion.     Mental Status: Normal mood and affect. Normal behavior. Normal judgment and thought content.   Assessment   22 y.o. [id number] at 934w2d by  11/14/2018, by Last Menstrual Period presenting for routine prenatal visit  Plan   THIRD Problems (from 03/25/18 to present)            Please refer to After Visit Summary for other counseling recommendations.   [facility]  Tresea MallJane Kc Sedlak, CNM 04/27/2018 4:57 PM

## 2018-04-28 ENCOUNTER — Ambulatory Visit: Payer: Medicaid Other | Admitting: Anesthesiology

## 2018-04-28 ENCOUNTER — Encounter
Admission: RE | Disposition: A | Discharge status: Home / Self Care | Payer: Self-pay | Source: Home / Self Care | Attending: Obstetrics and Gynecology

## 2018-04-28 ENCOUNTER — Ambulatory Visit
Admission: RE | Admit: 2018-04-28 | Discharge: 2018-04-28 | Disposition: A | Discharge status: Home / Self Care | Payer: Medicaid Other | Attending: Obstetrics and Gynecology | Admitting: Obstetrics and Gynecology

## 2018-04-28 ENCOUNTER — Encounter: Payer: Self-pay | Admitting: Anesthesiology

## 2018-04-28 DIAGNOSIS — E669 Obesity, unspecified: Secondary | ICD-10-CM | POA: Diagnosis not present

## 2018-04-28 DIAGNOSIS — Z30430 Encounter for insertion of intrauterine contraceptive device: Secondary | ICD-10-CM

## 2018-04-28 DIAGNOSIS — Z87891 Personal history of nicotine dependence: Secondary | ICD-10-CM | POA: Insufficient documentation

## 2018-04-28 DIAGNOSIS — F419 Anxiety disorder, unspecified: Secondary | ICD-10-CM | POA: Insufficient documentation

## 2018-04-28 DIAGNOSIS — O034 Incomplete spontaneous abortion without complication: Secondary | ICD-10-CM | POA: Insufficient documentation

## 2018-04-28 HISTORY — PX: INTRAUTERINE DEVICE (IUD) INSERTION: SHX5877

## 2018-04-28 HISTORY — PX: DILATION AND EVACUATION: SHX1459

## 2018-04-28 LAB — CBC
HCT: 38.4 % (ref 36.0–46.0)
Hemoglobin: 12.7 g/dL (ref 12.0–15.0)
MCH: 27.1 pg (ref 26.0–34.0)
MCHC: 33.1 g/dL (ref 30.0–36.0)
MCV: 82.1 fL (ref 80.0–100.0)
Platelets: 234 10*3/uL (ref 150–400)
RBC: 4.68 MIL/uL (ref 3.87–5.11)
RDW: 13.5 % (ref 11.5–15.5)
WBC: 6.5 10*3/uL (ref 4.0–10.5)
nRBC: 0 % (ref 0.0–0.2)

## 2018-04-28 SURGERY — DILATION AND EVACUATION, UTERUS
Anesthesia: General

## 2018-04-28 MED ORDER — IBUPROFEN 600 MG PO TABS: 600.0000 mg | ORAL_TABLET | Freq: Four times a day (QID) | ORAL | 3 refills | Status: DC | PRN

## 2018-04-28 MED ORDER — FENTANYL CITRATE (PF) 100 MCG/2ML IJ SOLN
INTRAMUSCULAR | Status: AC
  Administered 2018-04-28: 25 ug via INTRAVENOUS
  Filled 2018-04-28: qty 2

## 2018-04-28 MED ORDER — GABAPENTIN 300 MG PO CAPS
ORAL_CAPSULE | ORAL | Status: AC
  Filled 2018-04-28: qty 2

## 2018-04-28 MED ORDER — ACETAMINOPHEN 500 MG PO TABS
1000.0000 mg | ORAL_TABLET | Freq: Once | ORAL | Status: AC
  Administered 2018-04-28: 1000 mg via ORAL

## 2018-04-28 MED ORDER — LIDOCAINE HCL (CARDIAC) PF 100 MG/5ML IV SOSY
PREFILLED_SYRINGE | INTRAVENOUS | Status: DC | PRN
  Administered 2018-04-28: 60 mg via INTRAVENOUS

## 2018-04-28 MED ORDER — ACETAMINOPHEN 500 MG PO TABS
ORAL_TABLET | ORAL | Status: AC
  Administered 2018-04-28: 1000 mg via ORAL
  Filled 2018-04-28: qty 2

## 2018-04-28 MED ORDER — SUGAMMADEX SODIUM 200 MG/2ML IV SOLN
INTRAVENOUS | Status: DC | PRN
  Administered 2018-04-28: 100 mg via INTRAVENOUS

## 2018-04-28 MED ORDER — ONDANSETRON HCL 4 MG/2ML IJ SOLN
4.0000 mg | Freq: Once | INTRAMUSCULAR | Status: AC | PRN
  Administered 2018-04-28: 4 mg via INTRAVENOUS

## 2018-04-28 MED ORDER — ACETAMINOPHEN 500 MG PO TABS: 1000.0000 mg | ORAL_TABLET | Freq: Four times a day (QID) | ORAL | 2 refills | Status: AC | PRN

## 2018-04-28 MED ORDER — MIDAZOLAM HCL 2 MG/2ML IJ SOLN
INTRAMUSCULAR | Status: DC | PRN
  Administered 2018-04-28: 2 mg via INTRAVENOUS

## 2018-04-28 MED ORDER — GABAPENTIN 300 MG PO CAPS
600.0000 mg | ORAL_CAPSULE | Freq: Once | ORAL | Status: AC
  Administered 2018-04-28: 600 mg via ORAL

## 2018-04-28 MED ORDER — MIDAZOLAM HCL 2 MG/2ML IJ SOLN
INTRAMUSCULAR | Status: AC
  Filled 2018-04-28: qty 2

## 2018-04-28 MED ORDER — DEXAMETHASONE SODIUM PHOSPHATE 10 MG/ML IJ SOLN
INTRAMUSCULAR | Status: AC
  Filled 2018-04-28: qty 1

## 2018-04-28 MED ORDER — ROCURONIUM BROMIDE 100 MG/10ML IV SOLN
INTRAVENOUS | Status: DC | PRN
  Administered 2018-04-28: 30 mg via INTRAVENOUS

## 2018-04-28 MED ORDER — FENTANYL CITRATE (PF) 100 MCG/2ML IJ SOLN
INTRAMUSCULAR | Status: AC
  Filled 2018-04-28: qty 2

## 2018-04-28 MED ORDER — PROPOFOL 10 MG/ML IV BOLUS
INTRAVENOUS | Status: DC | PRN
  Administered 2018-04-28: 200 mg via INTRAVENOUS

## 2018-04-28 MED ORDER — HYDROMORPHONE HCL 1 MG/ML IJ SOLN
INTRAMUSCULAR | Status: AC
  Administered 2018-04-28: 0.25 mg via INTRAVENOUS
  Filled 2018-04-28: qty 1

## 2018-04-28 MED ORDER — FENTANYL CITRATE (PF) 100 MCG/2ML IJ SOLN
25.0000 ug | INTRAMUSCULAR | Status: AC | PRN
  Administered 2018-04-28 (×6): 25 ug via INTRAVENOUS

## 2018-04-28 MED ORDER — DOXYCYCLINE HYCLATE 100 MG IV SOLR
200.0000 mg | INTRAVENOUS | Status: AC
  Administered 2018-04-28: 200 mg via INTRAVENOUS
  Filled 2018-04-28: qty 200

## 2018-04-28 MED ORDER — ONDANSETRON HCL 4 MG/2ML IJ SOLN
INTRAMUSCULAR | Status: AC
  Filled 2018-04-28: qty 2

## 2018-04-28 MED ORDER — KETOROLAC TROMETHAMINE 30 MG/ML IJ SOLN
INTRAMUSCULAR | Status: DC | PRN
  Administered 2018-04-28: 30 mg via INTRAVENOUS

## 2018-04-28 MED ORDER — DEXAMETHASONE SODIUM PHOSPHATE 10 MG/ML IJ SOLN
INTRAMUSCULAR | Status: DC | PRN
  Administered 2018-04-28: 10 mg via INTRAVENOUS

## 2018-04-28 MED ORDER — HYDROMORPHONE HCL 1 MG/ML IJ SOLN
0.2500 mg | INTRAMUSCULAR | Status: AC | PRN
  Administered 2018-04-28 (×8): 0.25 mg via INTRAVENOUS

## 2018-04-28 MED ORDER — PROPOFOL 10 MG/ML IV BOLUS
INTRAVENOUS | Status: AC
  Filled 2018-04-28: qty 20

## 2018-04-28 MED ORDER — LACTATED RINGERS IV SOLN
INTRAVENOUS | Status: DC
  Administered 2018-04-28 (×2): via INTRAVENOUS

## 2018-04-28 MED ORDER — FENTANYL CITRATE (PF) 100 MCG/2ML IJ SOLN
INTRAMUSCULAR | Status: DC | PRN
  Administered 2018-04-28: 100 ug via INTRAVENOUS

## 2018-04-28 SURGICAL SUPPLY — 23 items
BAG COUNTER SPONGE EZ (MISCELLANEOUS) ×2 IMPLANT
CATH ROBINSON RED A/P 16FR (CATHETERS) ×3 IMPLANT
COUNTER SPONGE BAG EZ (MISCELLANEOUS) ×1
FILTER UTR ASPR SPEC (MISCELLANEOUS) ×1 IMPLANT
FLTR UTR ASPR SPEC (MISCELLANEOUS) ×3
GLOVE BIO SURGEON STRL SZ 6.5 (GLOVE) ×2 IMPLANT
GLOVE BIO SURGEONS STRL SZ 6.5 (GLOVE) ×1
GOWN STRL REUS W/ TWL LRG LVL3 (GOWN DISPOSABLE) ×2 IMPLANT
GOWN STRL REUS W/TWL LRG LVL3 (GOWN DISPOSABLE) ×4
HANDLE YANKAUER SUCT BULB TIP (MISCELLANEOUS) ×3 IMPLANT
KIT BERKELEY 1ST TRIMESTER 3/8 (MISCELLANEOUS) ×3 IMPLANT
KIT TURNOVER CYSTO (KITS) ×3 IMPLANT
Mirena levonorgestrel-releasing intrauterine syste ×3 IMPLANT
NS IRRIG 500ML POUR BTL (IV SOLUTION) ×3 IMPLANT
PACK DNC HYST (MISCELLANEOUS) ×3 IMPLANT
PAD OB MATERNITY 4.3X12.25 (PERSONAL CARE ITEMS) ×3 IMPLANT
PAD PREP 24X41 OB/GYN DISP (PERSONAL CARE ITEMS) ×3 IMPLANT
SET BERKELEY SUCTION TUBING (SUCTIONS) ×3 IMPLANT
TOWEL OR 17X26 4PK STRL BLUE (TOWEL DISPOSABLE) ×3 IMPLANT
VACURETTE 10 RIGID CVD (CANNULA) IMPLANT
VACURETTE 12 RIGID CVD (CANNULA) IMPLANT
VACURETTE 8 RIGID CVD (CANNULA) IMPLANT
VACURETTE 8MM F TIP (MISCELLANEOUS) ×3 IMPLANT

## 2018-04-28 NOTE — Anesthesia Post-op Follow-up Note (Signed)
Anesthesia QCDR form completed.        

## 2018-04-28 NOTE — Anesthesia Preprocedure Evaluation (Addendum)
Anesthesia Evaluation  Patient identified by MRN, date of birth, ID band Patient awake    Reviewed: Allergy & Precautions, NPO status , Patient's Chart, lab work & pertinent test results  History of Anesthesia Complications Negative for: history of anesthetic complications  Airway Mallampati: III  TM Distance: >3 FB Neck ROM: Full    Dental no notable dental hx.    Pulmonary neg pulmonary ROS, former smoker,    Pulmonary exam normal breath sounds clear to auscultation       Cardiovascular Exercise Tolerance: Good negative cardio ROS Normal cardiovascular exam Rhythm:Regular Rate:Normal     Neuro/Psych  Headaches, Anxiety negative psych ROS   GI/Hepatic negative GI ROS, Neg liver ROS,   Endo/Other  negative endocrine ROS  Renal/GU negative Renal ROS  negative genitourinary   Musculoskeletal negative musculoskeletal ROS (+)   Abdominal   Peds negative pediatric ROS (+)  Hematology negative hematology ROS (+) anemia ,   Anesthesia Other Findings   Reproductive/Obstetrics negative OB ROS                             Anesthesia Physical  Anesthesia Plan  ASA: II  Anesthesia Plan: General   Post-op Pain Management:    Induction: Intravenous  PONV Risk Score and Plan:   Airway Management Planned: Oral ETT  Additional Equipment:   Intra-op Plan:   Post-operative Plan: Extubation in OR  Informed Consent: I have reviewed the patients History and Physical, chart, labs and discussed the procedure including the risks, benefits and alternatives for the proposed anesthesia with the patient or authorized representative who has indicated his/her understanding and acceptance.     Plan Discussed with: CRNA and Surgeon  Anesthesia Plan Comments:         Anesthesia Quick Evaluation

## 2018-04-28 NOTE — Anesthesia Procedure Notes (Signed)
Procedure Name: Intubation Date/Time: 04/28/2018 11:54 AM Performed by: Gentry Fitz, CRNA Pre-anesthesia Checklist: Patient identified, Emergency Drugs available, Suction available, Patient being monitored and Timeout performed Patient Re-evaluated:Patient Re-evaluated prior to induction Oxygen Delivery Method: Circle system utilized Preoxygenation: Pre-oxygenation with 100% oxygen Induction Type: IV induction Ventilation: Mask ventilation without difficulty Laryngoscope Size: Mac and 4 Grade View: Grade II Tube type: Oral Tube size: 7.0 mm Number of attempts: 1 Airway Equipment and Method: Stylet Placement Confirmation: ETT inserted through vocal cords under direct vision,  positive ETCO2 and breath sounds checked- equal and bilateral Secured at: 21 cm Dental Injury: Teeth and Oropharynx as per pre-operative assessment

## 2018-04-28 NOTE — H&P (Signed)
History and Physical   Misty Collins is an 22 y.o. female.  HPI: She presented yesterday to the office for an [place]. She was found to have a nonviable pregnancy with no fetal pole seen in more than 2 weeks. She has been having a small amount of dark red vaginal bleeding. She has had some uterine cramping and pain. She would like to have a mirena IUD inserted after the D&E. She reports that this pregnancy was not intended. She has had an IUD before and was happy with it.   Past Medical History:  Diagnosis Date  . Anxiety   . Closed fracture of pubic ramus (HCC)    2016  . Dysmenorrhea   . E coli infection 06/16/2014  . 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 Headache   . Iron deficiency anemia   . Obesity   . Pyelonephritis affecting pregnancy 12/2011  . Urinary tract infection     Past Surgical History:  Procedure Laterality Date  . WISDOM TOOTH EXTRACTION      Family History  Problem Relation Age of Onset  . Diabetes Mother   . Hypertension Mother   . Rheum arthritis Mother   . Hypothyroidism Mother        Hashimoto's  . Obesity Mother   . Diabetes Maternal Grandmother   . Heart disease Maternal Grandmother   . Cancer Maternal Grandfather        LUNG  . Diabetes Paternal Grandmother   . Heart disease Paternal Grandfather   . Chromosomal disorder Son        Trisomy 18/ oomphalacele/ stillborn    Social History:  reports that she has quit smoking. She has never used smokeless tobacco. She reports that she does not drink alcohol or use drugs.  Allergies:  Allergies  Allergen Reactions  . Other     Can not eat red meat    Medications: I have reviewed the patient's current medications.  No results found for this or any previous visit (from the past 48 hour(s)).  US Ob Transvaginal  Result Date: 04/27/2018 Patient Name: Misty Collins DOB: 28-Dec-1995 MRN: [id number] ULTRASOUND REPORT Location: [facility] Date of Service: 04/27/2018  Indications: Missed AB Findings: Irregular shaped gestational sac seen with echogenic material. No fetal pole/yolk sac seen. Right Ovary is normal in appearance. Left Ovary is normal appearance. Survey of the adnexa demonstrates no adnexal masses. There is no free peritoneal fluid in the cul de sac. Impression: 1. Retained products. Recommendations: 1.Clinical correlation with the patient's History and Physical Exam. Darlina Guys, RDMS RVT Missed abortion based on persistent gestational sac.  Fetal pole not identified in today's study.  Options include expectant, medical, or surgical management. Vena Austria, MD, Evern Core [facility], [facility] 04/27/2018, 3:20 PM    Review of Systems  Constitutional: Negative for chills, fever, malaise/fatigue and weight loss.  HENT: Negative for congestion, hearing loss and sinus pain.   Eyes: Negative for blurred vision and double vision.  Respiratory: Negative for cough, sputum production, shortness of breath and wheezing.   Cardiovascular: Negative for chest pain, palpitations, orthopnea and leg swelling.  Gastrointestinal: Negative for abdominal pain, constipation, diarrhea, nausea and vomiting.  Genitourinary: Negative for dysuria, flank pain, frequency, hematuria and urgency.  Musculoskeletal: Negative for back pain, falls and joint pain.  Skin: Negative for itching and rash.  Neurological: Negative for dizziness and headaches.  Psychiatric/Behavioral: Negative for depression, substance abuse and suicidal ideas.  The patient is not nervous/anxious.    Blood pressure 105/74, pulse 80, temperature (!) 97.5 F (36.4 C), temperature source Temporal, resp. rate 16, last menstrual period 02/07/2018, SpO2 99 %, unknown if currently breastfeeding.    Physical Exam  Nursing note and vitals reviewed. Constitutional: She is oriented to person, place, and time. She appears well-developed and well-nourished.  HENT:  Head: Normocephalic and  atraumatic.  Cardiovascular: Normal rate and regular rhythm.  Respiratory: Effort normal and breath sounds normal.  GI: Soft. Bowel sounds are normal.  Musculoskeletal: Normal range of motion.  Neurological: She is alert and oriented to person, place, and time.  Skin: Skin is warm and dry.  Psychiatric: She has a normal mood and affect. Her behavior is normal. Judgment and thought content normal.    Assessment/Plan: 22 yo with an incomplete miscarriage. She has elected to have a D&E. She would like and IUD to be inserted after the procedure for contraception. She understands that she has a higher risk of expulsion with placement of the IUD today but would like to assume that risk. Discussed risks of hemorrhage and perforation associated with D&E with the patient. All questions answered.  She is Rh negative. She recently had Rhogam in the ER  On 04/14/18  [name] 04/28/2018, 10:49 AM

## 2018-04-28 NOTE — Op Note (Signed)
Operative Note  04/28/2018  PRE-OP DIAGNOSIS: Incomplete Miscarriage  POST-OP DIAGNOSIS: Incomplete miscarriage   SURGEON:Dnasia Gauna MD  PROCEDURE: Procedure(s): DILATATION AND EVACUATION INTRAUTERINE DEVICE (IUD) INSERTION   ANESTHESIA: Choice   ESTIMATED BLOOD LOSS: 100cc  SPECIMENS:Products of conception  COMPLICATIONS: None  DISPOSITION: PACU - hemodynamically stable.  CONDITION: stable  FINDINGS: Exam under anesthesia revealed small, mobile 10cm uterus with no masses and bilateral adnexa without masses or fullness.   PROCEDURE IN DETAIL: After informed consent was obtained, the patient was taken to the operating room where anesthesia was obtained without difficulty. The patient was positioned in the dorsal lithotomy position in [place] stirrups. The patient's bladder was catheterized with an in and out foley catheter. The patient was examined under anesthesia, with the above noted findings. The weighted speculum was placed inside the patient's vagina, and the the anterior lip of the cervix was seen and grasped with the tenaculum. The uterine cavity was sounded to 10cm, and then the cervix was progressively dilated to a 20 French-Pratt dilator. The uterine cavity was curetted until a gritty texture was noted using both the 8 mm flexible curette and the straight curette. Products of conception were yielded and sent to pathology.   Once all products of conception were removed the Mirena IUD was inserted. Uterus sounded to 10 cm.   IUD placed per manufacturer's recommendations.  Strings trimmed to 3 cm. Tenaculum was removed, good hemostasis noted.  Patient tolerated procedure well.   Excellent hemostasis was noted, and all instruments were removed, with excellent hemostasis noted throughout. She was then taken out of dorsal lithotomy.  The patient tolerated the procedure well. Sponge, lap and needle counts were correct x2. The patient was taken to recovery room in excellent  condition.  Patient was given post-procedure instructions.  She was advised to have backup contraception for one week.  Patient was also asked to check IUD strings periodically and follow up in 4 weeks for IUD check.  Adelene Idlerhristanna Juanya Villavicencio MD [facility], [facility] 04/28/18 12:45 PM

## 2018-04-28 NOTE — Discharge Instructions (Addendum)
Levonorgestrel intrauterine device (IUD) °What is this medicine? °LEVONORGESTREL IUD (LEE voe nor jes trel) is a contraceptive (birth control) device. The device is placed inside the uterus by a healthcare professional. It is used to prevent pregnancy. This device can also be used to treat heavy bleeding that occurs during your period. °This medicine may be used for other purposes; ask your health care provider or pharmacist if you have questions. °COMMON BRAND NAME(S): Kyleena, LILETTA, Mirena, Skyla °What should I tell my health care provider before I take this medicine? °They need to know if you have any of these conditions: °-abnormal Pap smear °-cancer of the breast, uterus, or cervix °-diabetes °-endometritis °-genital or pelvic infection now or in the past °-have more than one sexual partner or your partner has more than one partner °-heart disease °-history of an ectopic or tubal pregnancy °-immune system problems °-IUD in place °-liver disease or tumor °-problems with blood clots or take blood-thinners °-seizures °-use intravenous drugs °-uterus of unusual shape °-vaginal bleeding that has not been explained °-an unusual or allergic reaction to levonorgestrel, other hormones, silicone, or polyethylene, medicines, foods, dyes, or preservatives °-pregnant or trying to get pregnant °-breast-feeding °How should I use this medicine? °This device is placed inside the uterus by a health care professional. °Talk to your pediatrician regarding the use of this medicine in children. Special care may be needed. °Overdosage: If you think you have taken too much of this medicine contact a poison control center or emergency room at once. °NOTE: This medicine is only for you. Do not share this medicine with others. °What if I miss a dose? °This does not apply. Depending on the brand of device you have inserted, the device will need to be replaced every 3 to 5 years if you wish to continue using this type of birth  control. °What may interact with this medicine? °Do not take this medicine with any of the following medications: °-amprenavir °-bosentan °-fosamprenavir °This medicine may also interact with the following medications: °-aprepitant °-armodafinil °-barbiturate medicines for inducing sleep or treating seizures °-bexarotene °-boceprevir °-griseofulvin °-medicines to treat seizures like carbamazepine, ethotoin, felbamate, oxcarbazepine, phenytoin, topiramate °-modafinil °-pioglitazone °-rifabutin °-rifampin °-rifapentine °-some medicines to treat HIV infection like atazanavir, efavirenz, indinavir, lopinavir, nelfinavir, tipranavir, ritonavir °-St. John's wort °-warfarin °This list may not describe all possible interactions. Give your health care provider a list of all the medicines, herbs, non-prescription drugs, or dietary supplements you use. Also tell them if you smoke, drink alcohol, or use illegal drugs. Some items may interact with your medicine. °What should I watch for while using this medicine? °Visit your doctor or health care professional for regular check ups. See your doctor if you or your partner has sexual contact with others, becomes HIV positive, or gets a sexual transmitted disease. °This product does not protect you against HIV infection (AIDS) or other sexually transmitted diseases. °You can check the placement of the IUD yourself by reaching up to the top of your vagina with clean fingers to feel the threads. Do not pull on the threads. It is a good habit to check placement after each menstrual period. Call your doctor right away if you feel more of the IUD than just the threads or if you cannot feel the threads at all. °The IUD may come out by itself. You may become pregnant if the device comes out. If you notice that the IUD has come out use a backup birth control method like condoms and call your   health care provider. Using tampons will not change the position of the IUD and are okay to use  during your period. This IUD can be safely scanned with magnetic resonance imaging (MRI) only under specific conditions. Before you have an MRI, tell your healthcare provider that you have an IUD in place, and which type of IUD you have in place. What side effects may I notice from receiving this medicine? Side effects that you should report to your doctor or health care professional as soon as possible: -allergic reactions like skin rash, itching or hives, swelling of the face, lips, or tongue -fever, flu-like symptoms -genital sores -high blood pressure -no menstrual period for 6 weeks during use -pain, swelling, warmth in the leg -pelvic pain or tenderness -severe or sudden headache -signs of pregnancy -stomach cramping -sudden shortness of breath -trouble with balance, talking, or walking -unusual vaginal bleeding, discharge -yellowing of the eyes or skin Side effects that usually do not require medical attention (report to your doctor or health care professional if they continue or are bothersome): -acne -breast pain -change in sex drive or performance -changes in weight -cramping, dizziness, or faintness while the device is being inserted -headache -irregular menstrual bleeding within first 3 to 6 months of use -nausea This list may not describe all possible side effects. Call your doctor for medical advice about side effects. You may report side effects to FDA at 1-800-FDA-1088. Where should I keep my medicine? This does not apply. NOTE: This sheet is a summary. It may not cover all possible information. If you have questions about this medicine, talk to your doctor, pharmacist, or health care provider.  2018 Elsevier/Gold Standard (2016-02-16 14:14:56)   Dilation and Curettage or Vacuum Curettage, Care After These instructions give you information about caring for yourself after your procedure. Your doctor may also give you more specific instructions. Call your doctor if  you have any problems or questions after your procedure. Follow these instructions at home: Activity  Do not drive or use heavy machinery while taking prescription pain medicine.  For 24 hours after your procedure, avoid driving.  Take short walks often, followed by rest periods. Ask your doctor what activities are safe for you. After one or two days, you may be able to return to your normal activities.  Do not lift anything that is heavier than 10 lb (4.5 kg) until your doctor approves.  For at least 2 weeks, or as long as told by your doctor: ? Do not douche. ? Do not use tampons. ? Do not have sex. General instructions  Take over-the-counter and prescription medicines only as told by your doctor. This is very important if you take blood thinning medicine.  Do not take baths, swim, or use a hot tub until your doctor approves. Take showers instead of baths.  Wear compression stockings as told by your doctor.  It is up to you to get the results of your procedure. Ask your doctor when your results will be ready.  Keep all follow-up visits as told by your doctor. This is important. Contact a doctor if:  You have very bad cramps that get worse or do not get better with medicine.  You have very bad pain in your belly (abdomen).  You cannot drink fluids without throwing up (vomiting).  You get pain in a different part of the area between your belly and thighs (pelvis).  You have bad-smelling discharge from your vagina.  You have a rash. Get help right  away if:  You are bleeding a lot from your vagina. A lot of bleeding means soaking more than one sanitary pad in an hour, for 2 hours in a row.  You have clumps of blood (blood clots) coming from your vagina.  You have a fever or chills.  Your belly feels very tender or hard.  You have chest pain.  You have trouble breathing.  You cough up blood.  You feel dizzy.  You feel light-headed.  You pass out  (faint).  You have pain in your neck or shoulder area. Summary  Take short walks often, followed by rest periods. Ask your doctor what activities are safe for you. After one or two days, you may be able to return to your normal activities.  Do not lift anything that is heavier than 10 lb (4.5 kg) until your doctor approves.  Do not take baths, swim, or use a hot tub until your doctor approves. Take showers instead of baths.  Contact your doctor if you have any symptoms of infection, like bad-smelling discharge from your vagina. This information is not intended to replace advice given to you by your health care provider. Make sure you discuss any questions you have with your health care provider. Document Released: 02/13/2008 Document Revised: 01/22/2016 Document Reviewed: 01/22/2016 Elsevier Interactive Patient Education  2017 Elsevier Inc.  AMBULATORY SURGERY  DISCHARGE INSTRUCTIONS   1) The drugs that you were given will stay in your system until tomorrow so for the next 24 hours you should not:  A) Drive an automobile B) Make any legal decisions C) Drink any alcoholic beverage   2) You may resume regular meals tomorrow.  Today it is better to start with liquids and gradually work up to solid foods.  You may eat anything you prefer, but it is better to start with liquids, then soup and crackers, and gradually work up to solid foods.   3) Please notify your doctor immediately if you have any unusual bleeding, trouble breathing, redness and pain at the surgery site, drainage, fever, or pain not relieved by medication.    4) Additional Instructions:        Please contact your physician with any problems or Same Day Surgery at [phone], Monday through Friday 6 am to 4 pm, or  at [facility] Main number at [phone].

## 2018-04-28 NOTE — Transfer of Care (Signed)
Immediate Anesthesia Transfer of Care Note  Patient: Misty DickinsonChelsey Renee Collins  Procedure(s) Performed: DILATATION AND EVACUATION (N/A ) INTRAUTERINE DEVICE (IUD) INSERTION (N/A )  Patient Location: PACU  Anesthesia Type:General  Level of Consciousness: awake, alert  and patient cooperative  Airway & Oxygen Therapy: Patient Spontanous Breathing and Patient connected to face mask oxygen  Post-op Assessment: Report given to RN and Post -op Vital signs reviewed and stable  Post vital signs: Reviewed and stable  Last Vitals:  Vitals Value Taken Time  BP 125/71 04/28/2018 12:39 PM  Temp 36.6 C 04/28/2018 12:39 PM  Pulse 73 04/28/2018 12:45 PM  Resp 22 04/28/2018 12:45 PM  SpO2 93 % 04/28/2018 12:45 PM  Vitals shown include unvalidated device data.  Last Pain:  Vitals:   04/28/18 1239  TempSrc:   PainSc: 0-No pain         Complications: No apparent anesthesia complications

## 2018-04-29 ENCOUNTER — Encounter: Payer: Self-pay | Admitting: Obstetrics and Gynecology

## 2018-04-29 LAB — SURGICAL PATHOLOGY

## 2018-04-30 LAB — BPAM RBC
Blood Product Expiration Date: 201912242359
Blood Product Expiration Date: 201912242359
Unit Type and Rh: 9500
Unit Type and Rh: 9500

## 2018-04-30 LAB — TYPE AND SCREEN
ABO/RH(D): B NEG
Antibody Screen: POSITIVE
Unit division: 0
Unit division: 0

## 2018-04-30 NOTE — Anesthesia Postprocedure Evaluation (Signed)
Anesthesia Post Note  Patient: Misty Collins  Procedure(s) Performed: DILATATION AND EVACUATION (N/A ) INTRAUTERINE DEVICE (IUD) INSERTION (N/A )  Patient location during evaluation: PACU Anesthesia Type: General Level of consciousness: awake and alert Pain management: pain level controlled Vital Signs Assessment: post-procedure vital signs reviewed and stable Respiratory status: spontaneous breathing, nonlabored ventilation, respiratory function stable and patient connected to nasal cannula oxygen Cardiovascular status: blood pressure returned to baseline and stable Postop Assessment: no apparent nausea or vomiting Anesthetic complications: no     Last Vitals:  Vitals:   04/28/18 1445 04/28/18 1542  BP: (!) 101/39 101/65  Pulse: (!) 55 (!) 58  Resp: 17 18  Temp: 36.4 C 36.4 C  SpO2: 97% 97%    Last Pain:  Vitals:   04/29/18 0830  TempSrc:   PainSc: 4                  Cleda MccreedyJoseph K Piscitello

## 2018-05-07 ENCOUNTER — Ambulatory Visit: Payer: Medicaid Other | Admitting: Obstetrics and Gynecology

## 2019-02-06 ENCOUNTER — Encounter (HOSPITAL_COMMUNITY): Payer: Self-pay

## 2019-03-22 ENCOUNTER — Other Ambulatory Visit: Payer: Self-pay

## 2019-03-22 ENCOUNTER — Emergency Department
Admission: EM | Admit: 2019-03-22 | Discharge: 2019-03-23 | Disposition: A | Discharge status: Home / Self Care | Payer: Medicaid Other | Attending: Student | Admitting: Student

## 2019-03-22 DIAGNOSIS — F1010 Alcohol abuse, uncomplicated: Secondary | ICD-10-CM | POA: Insufficient documentation

## 2019-03-22 DIAGNOSIS — Z87891 Personal history of nicotine dependence: Secondary | ICD-10-CM | POA: Insufficient documentation

## 2019-03-22 DIAGNOSIS — T50901A Poisoning by unspecified drugs, medicaments and biological substances, accidental (unintentional), initial encounter: Secondary | ICD-10-CM

## 2019-03-22 DIAGNOSIS — F1210 Cannabis abuse, uncomplicated: Secondary | ICD-10-CM | POA: Insufficient documentation

## 2019-03-22 DIAGNOSIS — T50904A Poisoning by unspecified drugs, medicaments and biological substances, undetermined, initial encounter: Secondary | ICD-10-CM | POA: Diagnosis present

## 2019-03-22 LAB — ETHANOL: Alcohol, Ethyl (B): <10 mg/dL (ref <10)

## 2019-03-22 LAB — SALICYLATE LEVEL: Salicylate Lvl: <7 mg/dL (ref 2.8–30.0)

## 2019-03-22 LAB — ACETAMINOPHEN LEVEL: Acetaminophen (Tylenol), Serum: <10 ug/mL — ABNORMAL LOW (ref 10–30)
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
## 2019-03-22 NOTE — ED Notes (Signed)
Labs obtained via butterfly stick.

## 2019-03-22 NOTE — ED Notes (Signed)
Unable to ontain IV acess, IV consult placed, the IV team was also not able to obtain aces or lab work.

## 2019-03-22 NOTE — ED Notes (Signed)
parient sleeping awakens easily to name.
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
## 2019-03-23 MED ORDER — ACETAMINOPHEN 500 MG PO TABS
1000.0000 mg | ORAL_TABLET | Freq: Once | ORAL | Status: AC
  Administered 2019-03-23: 05:00:00 1000 mg via ORAL
  Filled 2019-03-23: qty 2

## 2019-03-23 NOTE — ED Provider Notes (Signed)
-----------------------------------------   4:36 AM on 03/23/2019 -----------------------------------------  The patient is awake, alert, ambulating without difficulty, and clinically sober.  Sister is coming to pick her up.   Hinda Kehr, MD 03/23/19 [phone]

## 2019-03-23 NOTE — ED Notes (Signed)
Pt sister contacted and attempting to find pt transport home.

## 2019-03-23 NOTE — ED Notes (Signed)
Pt assisted with getting dressed by this RN, Alma Friendly, NT, and Lorriane Shire, RN. Pt was placed in wheelchair and escorted to lobby to await ride from step father.

## 2019-03-23 NOTE — ED Notes (Signed)
Pt still sleeping in room at this time. Pt in NAD.

## 2019-09-07 ENCOUNTER — Other Ambulatory Visit: Payer: Self-pay

## 2019-09-07 ENCOUNTER — Ambulatory Visit (INDEPENDENT_AMBULATORY_CARE_PROVIDER_SITE_OTHER): Payer: Medicaid Other | Admitting: Obstetrics and Gynecology

## 2019-09-07 ENCOUNTER — Encounter: Payer: Self-pay | Admitting: Obstetrics and Gynecology

## 2019-09-07 ENCOUNTER — Other Ambulatory Visit (HOSPITAL_COMMUNITY)
Admission: RE | Admit: 2019-09-07 | Discharge: 2019-09-07 | Disposition: A | Discharge status: Home / Self Care | Payer: Medicaid Other | Source: Ambulatory Visit | Attending: Obstetrics and Gynecology | Admitting: Obstetrics and Gynecology

## 2019-09-07 VITALS — BP 114/70 | Ht 67.0 in | Wt 239.0 lb

## 2019-09-07 DIAGNOSIS — Z975 Presence of (intrauterine) contraceptive device: Secondary | ICD-10-CM | POA: Diagnosis not present

## 2019-09-07 DIAGNOSIS — R102 Pelvic and perineal pain: Secondary | ICD-10-CM

## 2019-09-07 DIAGNOSIS — Z113 Encounter for screening for infections with a predominantly sexual mode of transmission: Secondary | ICD-10-CM | POA: Insufficient documentation

## 2019-09-07 DIAGNOSIS — N921 Excessive and frequent menstruation with irregular cycle: Secondary | ICD-10-CM | POA: Diagnosis not present

## 2019-09-07 LAB — POCT URINE PREGNANCY: Preg Test, Ur: NEGATIVE

## 2019-09-07 NOTE — Patient Instructions (Signed)
I value your feedback and entrusting us with your care. If you get a [place] patient survey, I would appreciate you taking the time to let us know about your experience today. Thank you!  As of April 29, 2019, your lab results will be released to your MyChart immediately, before I even have a chance to see them. Please give me time to review them and contact you if there are any abnormalities. Thank you for your patience.  

## 2019-09-07 NOTE — Progress Notes (Signed)
[place], [facility]   Chief Complaint  Patient presents with  . IUD check    been bleeding since friday, heavy flow, pelvic pain    HPI:      Ms. Misty Collins is a 24 y.o. G3P1101 who LMP was No LMP recorded. (Menstrual status: IUD)., presents today for heavy bleeding and pelvic pain for 5 days. Flow is mod to heavy and pain is sharp, achy and constant. Not taking any meds for pain. Pain causes nausea, but no other GI sx. No vag, urin sx. Mirena placed 12/19 and pt has been amenorrheic/no cramping prior to 5 days ago. She is sex active, no dyspareunia/postcoital bleeding before sx started. No LBP, fevers.    Past Medical History:  Diagnosis Date  . Anxiety   . Closed fracture of pubic ramus ([facility])    2016  . Dysmenorrhea   . E coli infection 06/16/2014  . 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 Headache   . Iron deficiency anemia   . Obesity   . Pyelonephritis affecting pregnancy 12/2011  . Urinary tract infection     Past Surgical History:  Procedure Laterality Date  . DILATION AND EVACUATION N/A 04/28/2018   Procedure: DILATATION AND EVACUATION;  Surgeon: Natale Milch, MD;  Location: [facility] ORS;  Service: Gynecology;  Laterality: N/A;  . INTRAUTERINE DEVICE (IUD) INSERTION N/A 04/28/2018   Procedure: INTRAUTERINE DEVICE (IUD) INSERTION;  Surgeon: Natale Milch, MD;  Location: [facility] ORS;  Service: Gynecology;  Laterality: N/A;  . WISDOM TOOTH EXTRACTION      Family History  Problem Relation Age of Onset  . Diabetes Mother   . Hypertension Mother   . Rheum arthritis Mother   . Hypothyroidism Mother        Hashimoto's  . Obesity Mother   . Diabetes Maternal Grandmother   . Heart disease Maternal Grandmother   . Cancer Maternal Grandfather        LUNG  . Diabetes Paternal Grandmother   . Heart disease Paternal Grandfather   . Chromosomal disorder Son        Trisomy 18/ oomphalacele/ stillborn    Social History    Socioeconomic History  . Marital status: Significant Other    Spouse name: Koleen Nimrod  . Number of children: 1  . Years of education: 11th  . Highest education level: Not on file  Occupational History  . Occupation: stay at home mom  Tobacco Use  . Smoking status: Former Games developer  . Smokeless tobacco: Never Used  Substance and Sexual Activity  . Alcohol use: No  . Drug use: No  . Sexual activity: Yes    Birth control/protection: I.U.D.    Comment: Mirena  Other Topics Concern  . Not on file  Social History Narrative  . Not on file   Social Determinants of Health   Financial Resource Strain:   . Difficulty of Paying Living Expenses:   Food Insecurity:   . Worried About Programme researcher, broadcasting/film/video in the Last Year:   . Barista in the Last Year:   Transportation Needs:   . Freight forwarder (Medical):   Marland Kitchen Lack of Transportation (Non-Medical):   Physical Activity:   . Days of Exercise per Week:   . Minutes of Exercise per Session:   Stress:   . Feeling of Stress :   Social Connections:   . Frequency of Communication with Friends and Family:   .  Frequency of Social Gatherings with Friends and Family:   . Attends Religious Services:   . Active Member of Clubs or Organizations:   . Attends Archivist Meetings:   Marland Kitchen Marital Status:   Intimate Partner Violence:   . Fear of Current or Ex-Partner:   . Emotionally Abused:   Marland Kitchen Physically Abused:   . Sexually Abused:     Outpatient Medications Prior to Visit  Medication Sig Dispense Refill  . levonorgestrel (MIRENA) 20 MCG/24HR IUD 1 each by Intrauterine route once. Dec 2019 at [facility]    . ibuprofen (ADVIL,MOTRIN) 600 MG tablet Take 1 tablet (600 mg total) by mouth every 6 (six) hours as needed. 60 tablet 3   No facility-administered medications prior to visit.      ROS:  Review of Systems  Constitutional: Negative for fever.  Gastrointestinal: Negative for blood in stool, constipation, diarrhea, nausea and  vomiting.  Genitourinary: Positive for pelvic pain and vaginal bleeding. Negative for dyspareunia, dysuria, flank pain, frequency, hematuria, urgency, vaginal discharge and vaginal pain.  Musculoskeletal: Negative for back pain.  Skin: Negative for rash.  BREAST: No symptoms   OBJECTIVE:   Vitals:  BP 114/70   Ht 5\' 7"  (1.702 m)   Wt 239 lb (108.4 kg)   BMI 37.43 kg/m   Physical Exam Vitals reviewed.  Constitutional:      Appearance: She is well-developed.  Pulmonary:     Effort: Pulmonary effort is normal.  Abdominal:     Palpations: Abdomen is soft.     Tenderness: There is abdominal tenderness in the suprapubic area. There is no guarding or rebound.  Genitourinary:    General: Normal vulva.     Pubic Area: No rash.      Labia:        Right: No rash, tenderness or lesion.        Left: No rash, tenderness or lesion.      Vagina: Normal. No vaginal discharge, erythema or tenderness.     Cervix: Normal.     Uterus: Normal. Tender. Not enlarged.      Adnexa:        Right: Tenderness present. No mass.         Left: Tenderness present. No mass.       Comments: IUD STRINGS IN CX OS Musculoskeletal:        General: Normal range of motion.     Cervical back: Normal range of motion.  Skin:    General: Skin is warm and dry.  Neurological:     General: No focal deficit present.     Mental Status: She is alert and oriented to person, place, and time.  Psychiatric:        Mood and Affect: Mood normal.        Behavior: Behavior normal.        Thought Content: Thought content normal.        Judgment: Judgment normal.     Results: Results for orders placed or performed in visit on 09/07/19 (from the past 24 hour(s))  POCT urine pregnancy     Status: Normal   Collection Time: 09/07/19 10:54 AM  Result Value Ref Range   Preg Test, Ur Negative Negative     Assessment/Plan: Breakthrough bleeding with IUD - Plan: POCT urine pregnancy, US PELVIS TRANSVAGINAL NON-OB (TV  ONLY); Neg UPT. Check STD testing and GYN u/s for placement. Will f/u with results. NSAIDs in meantime for pain.   Pelvic pain -  Plan: US PELVIS TRANSVAGINAL NON-OB (TV ONLY)  Screening for STD (sexually transmitted disease) - Plan: Cervicovaginal ancillary only    Return in about 1 day (around 09/08/2019) for GYN u/s for IUD placement/pelvic pain--[facility] to call pt.  Nathalia Wismer B. Ethelyne Erich, PA-C 09/07/2019 10:57 AM

## 2019-09-08 ENCOUNTER — Ambulatory Visit (INDEPENDENT_AMBULATORY_CARE_PROVIDER_SITE_OTHER): Payer: Medicaid Other

## 2019-09-08 ENCOUNTER — Telehealth: Payer: Self-pay | Admitting: Obstetrics and Gynecology

## 2019-09-08 ENCOUNTER — Other Ambulatory Visit: Payer: Self-pay

## 2019-09-08 DIAGNOSIS — N921 Excessive and frequent menstruation with irregular cycle: Secondary | ICD-10-CM

## 2019-09-08 DIAGNOSIS — R102 Pelvic and perineal pain: Secondary | ICD-10-CM | POA: Diagnosis not present

## 2019-09-08 DIAGNOSIS — Z975 Presence of (intrauterine) contraceptive device: Secondary | ICD-10-CM | POA: Diagnosis not present

## 2019-09-08 LAB — CERVICOVAGINAL ANCILLARY ONLY
Chlamydia: NEGATIVE
Comment: NEGATIVE
Comment: NORMAL
Neisseria Gonorrhea: NEGATIVE

## 2019-09-08 NOTE — Telephone Encounter (Signed)
Pt aware of normal u/s results and neg STD testing for AUB with IUD. Had neg UPT yesterday. Reassurance. Pt will follow for now. If bleeding persists, can try POPs for 1-2 packs for cycle control. F/u prn.   ULTRASOUND REPORT  Location: [facility]  Date of Service: 09/08/2019    Indications:Abnormal Uterine Bleeding with IUD   Findings:  The uterus is axial and measures 7.6 x 4.9 x 3.6 cm. Echo texture is homogenous without evidence of focal masses. The Endometrium measures 3.8 mm. The IUD is correctly placed within the uterus.   Right Ovary measures 3.7 x 2.9 x 1.6 cm. It is normal in appearance. Left Ovary measures 3.2 x 2.3 x 2.4 cm. It is normal in appearance. Survey of the adnexa demonstrates no adnexal masses. There is no free fluid in the cul de sac.  Impression: 1. Normal pelvic ultrasound.  2. The IUD is correctly placed within the uterus.   Recommendations: 1.Clinical correlation with the patient's History and Physical Exam.  Deanna Artis, RT

## 2019-09-22 ENCOUNTER — Ambulatory Visit
Admission: EM | Admit: 2019-09-22 | Discharge: 2019-09-22 | Disposition: A | Discharge status: Home / Self Care | Payer: Medicaid Other | Attending: Urgent Care | Admitting: Urgent Care

## 2019-09-22 ENCOUNTER — Encounter: Payer: Self-pay | Admitting: Emergency Medicine

## 2019-09-22 ENCOUNTER — Other Ambulatory Visit: Payer: Self-pay

## 2019-09-22 DIAGNOSIS — J039 Acute tonsillitis, unspecified: Secondary | ICD-10-CM | POA: Insufficient documentation

## 2019-09-22 LAB — GROUP A STREP BY PCR: Group A Strep by PCR: NOT DETECTED

## 2019-09-22 MED ORDER — AMOXICILLIN 500 MG PO CAPS: 500.0000 mg | ORAL_CAPSULE | Freq: Two times a day (BID) | ORAL | 0 refills | Status: AC

## 2019-09-22 MED ORDER — PREDNISONE 20 MG PO TABS: 40.0000 mg | ORAL_TABLET | Freq: Every day | ORAL | 0 refills | Status: DC

## 2019-09-22 NOTE — ED Triage Notes (Signed)
Patient c/o sore throat that started 2 days ago. She states she has had strep before and this feels the same.

## 2019-09-22 NOTE — Discharge Instructions (Addendum)
It was very nice seeing you today in clinic. Thank you for entrusting me with your care.   Rest and Stay HYDRATED. Water and electrolyte containing beverages (Gatorade, Pedialyte) are best to prevent dehydration and electrolyte abnormalities. Recommended warm salt water gargles, hard candies/lozenges, and hot tea with honey/lemon to help soothe the throat and reduce irritation. May use Tylenol and/or Ibuprofen as needed for pain/fever.    Make arrangements to follow up with your regular doctor in 1 week for re-evaluation if not improving. If your symptoms/condition worsens, please seek follow up care either here or in the ER. Please remember, our [place] providers are "right here with you" when you need us.   Again, it was my pleasure to take care of you today. Thank you for choosing our clinic. I hope that you start to feel better quickly.   Alichia Alridge, MSN, APRN, FNP-C, CEN Advanced Practice Provider [facility] 

## 2019-09-23 NOTE — ED Provider Notes (Addendum)
[place], [place]   Name: Misty Collins DOB: [birth year] MRN: [id number] CSN: [id number] PCP: [facility]  Arrival date and time:  09/22/19 1358  Chief Complaint:  Sore Throat  NOTE: Prior to seeing the patient today, I have reviewed the triage nursing documentation and vital signs. Clinical staff has updated patient's PMH/PSHx, current medication list, and drug allergies/intolerances to ensure comprehensive history available to assist in medical decision making.   History:   HPI: Misty Collins is a 24 y.o. female who presents today with complaints of a 2 day history of worsening sore throat. Patient has been febrile to  Tmax of 100.3 at home. She denies any other upper respiratory symptoms; no cough, otalgia, headache, or paranasal sinus tenderness. She is eating and drinking well. No nausea, vomiting, or abdominal pain. Patient has not experienced any dysphagia, stridor, or wheezing. PMH (+) for streptococcal pharyngitis and patient notes that this is similar to past episodes. No history of seasonal allergies.Patient denies being in close contact with anyone known to be ill. She has not been tested for SARS-CoV-2 (novel coronavirus) in the past 14 days; does not wish to be tested today. Despite her symptoms, patient has not taken any over the counter interventions to help improve/relieve her reported symptoms at home.   Past Medical History:  Diagnosis Date  . Anxiety   . Closed fracture of pubic ramus (HCC)    2016  . Dysmenorrhea   . E coli infection 06/16/2014  . 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 Headache   . Iron deficiency anemia   . Obesity   . Pyelonephritis affecting pregnancy 12/2011  . Urinary tract infection     Past Surgical History:  Procedure Laterality Date  . DILATION AND EVACUATION N/A 04/28/2018   Procedure: DILATATION AND EVACUATION;  Surgeon: Natale Milch, MD;  Location: [facility] ORS;  Service: Gynecology;   Laterality: N/A;  . INTRAUTERINE DEVICE (IUD) INSERTION N/A 04/28/2018   Procedure: INTRAUTERINE DEVICE (IUD) INSERTION;  Surgeon: Natale Milch, MD;  Location: [facility] ORS;  Service: Gynecology;  Laterality: N/A;  . WISDOM TOOTH EXTRACTION      Family History  Problem Relation Age of Onset  . Diabetes Mother   . Hypertension Mother   . Rheum arthritis Mother   . Hypothyroidism Mother        Hashimoto's  . Obesity Mother   . Diabetes Maternal Grandmother   . Heart disease Maternal Grandmother   . Cancer Maternal Grandfather        LUNG  . Diabetes Paternal Grandmother   . Heart disease Paternal Grandfather   . Chromosomal disorder Son        Trisomy 18/ oomphalacele/ stillborn    Social History   Tobacco Use  . Smoking status: Former Games developer  . Smokeless tobacco: Never Used  Substance Use Topics  . Alcohol use: No  . Drug use: No    Patient Active Problem List   Diagnosis Date Noted  . History of IUFD 03/27/2018  . Supervision of high risk pregnancy, antepartum 03/27/2018  . Irregular menses 03/27/2018  . Tobacco use 03/27/2018  . Anxiety   . Closed fracture of pubic ramus (HCC)   . Obesity   . Fetus with chromosomal abnormality 05/21/2011    Home Medications:    Current Meds  Medication Sig  . levonorgestrel (MIRENA) 20 MCG/24HR IUD 1 each by Intrauterine route once. Dec 2019 at [facility]  Allergies:   Other  Review of Systems (ROS):  Review of systems NEGATIVE unless otherwise noted in narrative H&P section.   Vital Signs: Today's Vitals   09/22/19 1413 09/22/19 1415 09/22/19 1436  BP:  126/80   Pulse:  (!) 101   Resp:  18   Temp:  98.4 F (36.9 C)   TempSrc:  Oral   SpO2:  98%   Weight: 230 lb (104.3 kg)    Height: 5\' 7"  (1.702 m)    PainSc: 8   8     Physical Exam: Physical Exam  Constitutional: She is oriented to person, place, and time and well-developed, well-nourished, and in no distress.  Acutely ill appearing; fatigued.  HENT:   Head: Normocephalic and atraumatic.  Right Ear: Tympanic membrane normal.  Left Ear: Tympanic membrane normal.  Nose: Nose normal.  Mouth/Throat: Uvula is midline and mucous membranes are normal. Posterior oropharyngeal edema and posterior oropharyngeal erythema present.  Tonsils grade II-III with (+) white exudative patches. No dysphagia; able to handle oral secretions and thin liquids. No SOB, stridor, or wheezing. (+) voice change.  Eyes: Pupils are equal, round, and reactive to light.  Cardiovascular: Normal rate, regular rhythm, normal heart sounds and intact distal pulses.  Pulmonary/Chest: Effort normal and breath sounds normal.  Lymphadenopathy:       Head (right side): Submandibular and tonsillar adenopathy present.       Head (left side): Submandibular and tonsillar adenopathy present.  Neurological: She is alert and oriented to person, place, and time. Gait normal.  Skin: Skin is warm and dry. No rash noted. She is not diaphoretic.  Psychiatric: Mood, memory, affect and judgment normal.  Nursing note and vitals reviewed.   Urgent Care Treatments / Results:   Orders Placed This Encounter  Procedures  . Group A Strep by PCR    LABS: PLEASE NOTE: all labs that were ordered this encounter are listed, however only abnormal results are displayed. Labs Reviewed  GROUP A STREP BY PCR    EKG: -None  RADIOLOGY: No results found.  PROCEDURES: Procedures  MEDICATIONS RECEIVED THIS VISIT: Medications - No data to display  PERTINENT CLINICAL COURSE NOTES/UPDATES:   Initial Impression / Assessment and Plan / Urgent Care Course:  Pertinent labs & imaging results that were available during my care of the patient were personally reviewed by me and considered in my medical decision making (see lab/imaging section of note for values and interpretations).  Misty Collins is a 25 y.o. female who presents to [facility] today with complaints of Sore Throat  Patient  acutely ill appearing (non-toxic) appearing in clinic today. She does not appear to be in any acute distress. Presenting symptoms (see HPI) and exam as documented above. No sick contact. No recent SARS-CoV-2 testing; wishes to defer today. Exam consistent with exudative tonsillitis. Treating with a 10 day course of amoxicillin and a short 4 day prednisone burst (40 mg/day). Reviewed supportive care; rest, hydration, warm salt water gargles, hard candies/lozenges, and hot tea with honey/lemon to help soothe the throat and reduce irritation. May use Tylenol and/or Ibuprofen as needed for pain/fever.   Discussed follow up with primary care physician in 1 week for re-evaluation. I have reviewed the follow up and strict return precautions for any new or worsening symptoms. Patient is aware of symptoms that would be deemed urgent/emergent, and would thus require further evaluation either here or in the emergency department. At the time of discharge, she verbalized understanding and  consent with the discharge plan as it was reviewed with her. All questions were fielded by provider and/or clinic staff prior to patient discharge.    Final Clinical Impressions / Urgent Care Diagnoses:   Final diagnoses:  Exudative tonsillitis    New Prescriptions:  [place] Controlled Substance Registry consulted? Not Applicable  Meds ordered this encounter  Medications  . amoxicillin (AMOXIL) 500 MG capsule    Sig: Take 1 capsule (500 mg total) by mouth 2 (two) times daily for 10 days.    Dispense:  20 capsule    Refill:  0  . predniSONE (DELTASONE) 20 MG tablet    Sig: Take 2 tablets (40 mg total) by mouth daily.    Dispense:  8 tablet    Refill:  0    Recommended Follow up Care:  Patient encouraged to follow up with the following provider within the specified time frame, or sooner as dictated by the severity of her symptoms. As always, she was instructed that for any urgent/emergent care needs, she should seek care  either here or in the emergency department for more immediate evaluation.  Follow-up Information    [place] In 1 week.   Why: General reassessment of symptoms if not improving Contact information: [address] [place] [address] [phone]         NOTE: This note was prepared using Dragon dictation software along with smaller phrase technology. Despite my best ability to proofread, there is the potential that transcriptional errors may still occur from this process, and are completely unintentional.    Verlee Monte, NP 09/23/19 1600

## 2020-05-16 ENCOUNTER — Encounter: Payer: Self-pay | Admitting: Emergency Medicine

## 2020-05-16 ENCOUNTER — Other Ambulatory Visit: Payer: Self-pay

## 2020-05-16 ENCOUNTER — Emergency Department
Admission: EM | Admit: 2020-05-16 | Discharge: 2020-05-16 | Disposition: A | Discharge status: Home / Self Care | Payer: Medicaid Other | Attending: Emergency Medicine | Admitting: Emergency Medicine

## 2020-05-16 DIAGNOSIS — M791 Myalgia, unspecified site: Secondary | ICD-10-CM | POA: Insufficient documentation

## 2020-05-16 DIAGNOSIS — J029 Acute pharyngitis, unspecified: Secondary | ICD-10-CM

## 2020-05-16 DIAGNOSIS — Z202 Contact with and (suspected) exposure to infections with a predominantly sexual mode of transmission: Secondary | ICD-10-CM | POA: Diagnosis not present

## 2020-05-16 DIAGNOSIS — R070 Pain in throat: Secondary | ICD-10-CM | POA: Insufficient documentation

## 2020-05-16 DIAGNOSIS — R509 Fever, unspecified: Secondary | ICD-10-CM | POA: Diagnosis not present

## 2020-05-16 DIAGNOSIS — R5383 Other fatigue: Secondary | ICD-10-CM | POA: Insufficient documentation

## 2020-05-16 DIAGNOSIS — Z87891 Personal history of nicotine dependence: Secondary | ICD-10-CM | POA: Insufficient documentation

## 2020-05-16 DIAGNOSIS — Z20822 Contact with and (suspected) exposure to covid-19: Secondary | ICD-10-CM

## 2020-05-16 LAB — WET PREP, GENITAL
Clue Cells Wet Prep HPF POC: NONE SEEN
Sperm: NONE SEEN
Trich, Wet Prep: NONE SEEN
Yeast Wet Prep HPF POC: NONE SEEN

## 2020-05-16 LAB — CHLAMYDIA/NGC RT PCR (ARMC ONLY)
Chlamydia Tr: NOT DETECTED
N gonorrhoeae: NOT DETECTED

## 2020-05-16 LAB — GROUP A STREP BY PCR: Group A Strep by PCR: NOT DETECTED

## 2020-05-16 LAB — RESP PANEL BY RT-PCR (FLU A&B, COVID) ARPGX2
Influenza A by PCR: NEGATIVE
Influenza B by PCR: NEGATIVE
SARS Coronavirus 2 by RT PCR: NEGATIVE

## 2020-05-16 LAB — HEPATITIS PANEL, ACUTE
HCV Ab: NONREACTIVE
Hep A IgM: NONREACTIVE
Hep B C IgM: NONREACTIVE
Hepatitis B Surface Ag: NONREACTIVE

## 2020-05-16 LAB — POC SARS CORONAVIRUS 2 AG -  ED: SARS Coronavirus 2 Ag: NEGATIVE

## 2020-05-16 MED ORDER — IBUPROFEN 400 MG PO TABS
400.0000 mg | ORAL_TABLET | Freq: Once | ORAL | Status: AC
  Administered 2020-05-16: 400 mg via ORAL
  Filled 2020-05-16: qty 1

## 2020-05-16 MED ORDER — ACETAMINOPHEN 500 MG PO TABS
1000.0000 mg | ORAL_TABLET | Freq: Once | ORAL | Status: AC
  Administered 2020-05-16: 1000 mg via ORAL
  Filled 2020-05-16: qty 2

## 2020-05-16 NOTE — ED Provider Notes (Signed)
[facility] Emergency Department Provider Note  ____________________________________________   Event Date/Time   First MD Initiated Contact with Patient 05/16/20 1431     (approximate)  I have reviewed the triage vital signs and the nursing notes.   HISTORY  Chief Complaint Fever and Sore Throat   HPI Misty Collins is a 24 y.o. female with past medical history of headache, deficiency, obesity, anxiety, and dysmenorrhea who presents for assessment approximately 2 to 3 days of sore throat, chills, subjective fevers, and myalgias. No chest pain, abdominal pain, vomiting, diarrhea, dysuria, rash, vaginal bleeding, vaginal discharge, burning with urination, difficulty swallowing, or other acute sick symptoms. Patient also request to be tested for STDs as she had possible exposure to 3 weeks ago while she denies any acute pelvic or vaginal symptoms. She does smoke but has not last couple days. Does not use EtOH regularly or any illegal drugs.          Past Medical History:  Diagnosis Date  . Anxiety   . Closed fracture of pubic ramus (HCC)    2016  . Dysmenorrhea   . E coli infection 06/16/2014  . 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 Headache   . Iron deficiency anemia   . Obesity   . Pyelonephritis affecting pregnancy 12/2011  . Urinary tract infection     Patient Active Problem List   Diagnosis Date Noted  . History of IUFD 03/27/2018  . Supervision of high risk pregnancy, antepartum 03/27/2018  . Irregular menses 03/27/2018  . Tobacco use 03/27/2018  . Anxiety   . Closed fracture of pubic ramus (HCC)   . Obesity   . 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 DILATION AND EVACUATION N/A 04/28/2018   Procedure: DILATATION AND EVACUATION;  Surgeon: Natale Milch, MD;  Location: [facility] ORS;  Service: Gynecology;  Laterality: N/A;  . INTRAUTERINE  DEVICE (IUD) INSERTION N/A 04/28/2018   Procedure: INTRAUTERINE DEVICE (IUD) INSERTION;  Surgeon: Natale Milch, MD;  Location: [facility] ORS;  Service: Gynecology;  Laterality: N/A;  . WISDOM TOOTH EXTRACTION      Prior to Admission medications   Medication Sig Start Date End Date Taking? Authorizing Provider  levonorgestrel (MIRENA) 20 MCG/24HR IUD 1 each by Intrauterine route once. Dec 2019 at [facility]    [provider]  predniSONE (DELTASONE) 20 MG tablet Take 2 tablets (40 mg total) by mouth daily. 09/22/19   Verlee Monte, NP    Allergies Other  Family History  Problem Relation Age of Onset  . Diabetes Mother   . Hypertension Mother   . Rheum arthritis Mother   . Hypothyroidism Mother        Hashimoto's  . Obesity Mother   . Diabetes Maternal Grandmother   . Heart disease Maternal Grandmother   . Cancer Maternal Grandfather        LUNG  . Diabetes Paternal Grandmother   . Heart disease Paternal Grandfather   . Chromosomal disorder Son        Trisomy 18/ oomphalacele/ stillborn    Social History Social History   Tobacco Use  . Smoking status: Former Games developer  . Smokeless tobacco: Never Used  Vaping Use  . Vaping Use: Never used  Substance Use Topics  . Alcohol use: No  . Drug use: No    Review of Systems  Review of Systems  Constitutional: Positive for  chills, fever and malaise/fatigue.  HENT: Negative for sore throat.   Eyes: Negative for pain.  Respiratory: Positive for cough and stridor.   Cardiovascular: Negative for chest pain.  Gastrointestinal: Negative for vomiting.  Genitourinary: Negative for dysuria.  Musculoskeletal: Positive for myalgias.  Skin: Negative for rash.  Neurological: Negative for seizures, loss of consciousness and headaches.  Psychiatric/Behavioral: Negative for suicidal ideas.  All other systems reviewed and are negative.     ____________________________________________   PHYSICAL EXAM:  VITAL SIGNS: ED Triage  Vitals  Enc Vitals Group     BP 05/16/20 1327 116/73     Pulse Rate 05/16/20 1327 86     Resp 05/16/20 1327 16     Temp 05/16/20 1327 98.2 F (36.8 C)     Temp Source 05/16/20 1327 Oral     SpO2 05/16/20 1327 98 %     Weight 05/16/20 1326 229 lb 15 oz (104.3 kg)     Height 05/16/20 1326 5\' 7"  (1.702 m)     Head Circumference --      Peak Flow --      Pain Score 05/16/20 1325 7     Pain Loc --      Pain Edu? --      Excl. in GC? --    Vitals:   05/16/20 1327  BP: 116/73  Pulse: 86  Resp: 16  Temp: 98.2 F (36.8 C)  SpO2: 98%   Physical Exam Vitals and nursing note reviewed.  Constitutional:      General: She is not in acute distress.    Appearance: She is well-developed and well-nourished.  HENT:     Head: Normocephalic and atraumatic.     Right Ear: External ear normal.     Left Ear: External ear normal.     Nose: Nose normal.     Mouth/Throat:     Mouth: Mucous membranes are moist.     Pharynx: No oropharyngeal exudate.  Eyes:     Conjunctiva/sclera: Conjunctivae normal.  Cardiovascular:     Rate and Rhythm: Normal rate and regular rhythm.     Heart sounds: No murmur heard.   Pulmonary:     Effort: Pulmonary effort is normal. No respiratory distress.     Breath sounds: Normal breath sounds.  Abdominal:     Palpations: Abdomen is soft.     Tenderness: There is no abdominal tenderness. There is no right CVA tenderness or left CVA tenderness.  Musculoskeletal:        General: No edema.     Cervical back: Neck supple.  Skin:    General: Skin is warm and dry.     Capillary Refill: Capillary refill takes less than 2 seconds.  Neurological:     General: No focal deficit present.     Mental Status: She is alert and oriented to person, place, and time.  Psychiatric:        Mood and Affect: Mood and affect and mood normal.     Oropharynx unremarkable. Patient has full range of motion of her neck. ____________________________________________   LABS (all  labs ordered are listed, but only abnormal results are displayed)  Labs Reviewed  WET PREP, GENITAL - Abnormal; Notable for the following components:      Result Value   WBC, Wet Prep HPF POC FEW (*)    All other components within normal limits  POC SARS CORONAVIRUS 2 AG -  ED - Normal  GROUP A STREP BY PCR  CHLAMYDIA/NGC  RT PCR ([facility] ONLY)  RESP PANEL BY RT-PCR (FLU A&B, COVID) ARPGX2  HIV ANTIBODY (ROUTINE TESTING W REFLEX)  RPR  HEPATITIS PANEL, ACUTE   ____________________________________________  ____________________________________________   PROCEDURES  Procedure(s) performed (including Critical Care):  Procedures   ____________________________________________   INITIAL IMPRESSION / ASSESSMENT AND PLAN / ED COURSE     Patient presents with [place] to history exam for assessment of sore throat myalgias decreased appetite and fatigue.  She is afebrile hemodynamically stable on arrival.  Patient is also requesting to be tested for STDs as she is concerned she may been exposed several weeks ago although does not have any vaginal or pelvic symptoms or other recent symptoms including dysuria.  On arrival patient is afebrile and hemodynamically stable.  Oropharynx is some posterior erythema without any other findings suggestive of retropharyngeal abscess, peritonsillar abscess or other deep space infection in the head or neck.  Lungs are clear bilaterally given absence of fever and low suspicion for superinfection with bacterial lung at this time.  Routine STD testing sent.  Wet prep unremarkable.  Advised patient that she can for follow-up remainder of her STD testing results with her PCP.  Presentation not consistent with PID or other acute intra-abdominal process at this time.  Rapid Covid is negative and PCR with influenza sent.  Advised patient of appropriate isolation precautions.  Discharge stable condition.  Strict return cautions advised and  discussed  ____________________________________________   FINAL CLINICAL IMPRESSION(S) / ED DIAGNOSES  Final diagnoses:  Possible exposure to STD  Person under investigation for COVID-19  Myalgia  Sore throat    Medications  acetaminophen (TYLENOL) tablet 1,000 mg (1,000 mg Oral Given 05/16/20 1541)  ibuprofen (ADVIL) tablet 400 mg (400 mg Oral Given 05/16/20 1541)     ED Discharge Orders    None       Note:  This document was prepared using Dragon voice recognition software and may include unintentional dictation errors.   Gilles Chiquito, MD 05/16/20 [phone]

## 2020-05-16 NOTE — ED Notes (Signed)
AVS reviewed with pt with verbal read back of understanding. Follow up care reviewed. Pt ambulated out of ER with a stead gait.

## 2020-05-16 NOTE — ED Notes (Signed)
See triage note; sore throat, chills and cough X 2-3 days

## 2020-05-16 NOTE — ED Triage Notes (Signed)
C?O chills, sore throat x 2 days.

## 2020-05-17 LAB — HIV ANTIBODY (ROUTINE TESTING W REFLEX): HIV Screen 4th Generation wRfx: NONREACTIVE

## 2020-05-17 LAB — RPR: RPR Ser Ql: NONREACTIVE

## 2021-11-15 ENCOUNTER — Ambulatory Visit
Admission: EM | Admit: 2021-11-15 | Discharge: 2021-11-15 | Disposition: A | Discharge status: Home / Self Care | Payer: Medicaid Other | Attending: Physician Assistant | Admitting: Physician Assistant

## 2021-11-15 DIAGNOSIS — R238 Other skin changes: Secondary | ICD-10-CM | POA: Diagnosis not present

## 2021-11-15 MED ORDER — VALACYCLOVIR HCL 1 G PO TABS: 1000.0000 mg | ORAL_TABLET | Freq: Three times a day (TID) | ORAL | 0 refills | Status: AC

## 2021-11-15 NOTE — ED Triage Notes (Signed)
Pt present rash  that has flared up on the left side of her face. Pt states that she has knot underneath her skin on the left side of her face.

## 2021-11-15 NOTE — ED Provider Notes (Signed)
[facility]    CSN: [id number] Arrival date & time: 11/15/21  1532      History   Chief Complaint Chief Complaint  Patient presents with   Rash    HPI Misty Collins is a 26 y.o. female presenting for vesicular painful rash of forehead and nose that started yesterday.  Patient reports she gets this rash at least once a year after she has been sunburned.  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  She says she is usually prescribed Valtrex and that takes care of it.  Patient request Valtrex today.  She says she has applied an over-the-counter zinc cream without relief.  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    Past Surgical History:  Procedure Laterality Date   DILATION AND EVACUATION N/A 04/28/2018   Procedure: DILATATION AND EVACUATION;  Surgeon: Natale Milch, MD;  Location: [facility] ORS;  Service: Gynecology;  Laterality: N/A;   INTRAUTERINE DEVICE (IUD) INSERTION N/A 04/28/2018   Procedure: INTRAUTERINE DEVICE (IUD) INSERTION;  Surgeon: Natale Milch, MD;  Location: [facility] ORS;  Service: Gynecology;  Laterality: N/A;   WISDOM TOOTH EXTRACTION      OB History     Gravida  3   Para  2   Term  1   Preterm  1   AB      Living  1      SAB      IAB       Ectopic      Multiple  0   Live Births  1            Home Medications    Prior to Admission medications   Medication Sig Start Date End Date Taking? Authorizing Provider  valACYclovir (VALTREX) 1000 MG tablet Take 1 tablet (1,000 mg total) by mouth 3 (three) times daily for 7 days. 11/15/21 11/22/21 Yes Shirlee Latch, PA-C  levonorgestrel (MIRENA) 20 MCG/24HR IUD 1 each by Intrauterine route once. Dec 2019 at [facility]    [provider]  predniSONE (DELTASONE) 20 MG tablet Take 2 tablets (40 mg total) by mouth daily. 09/22/19   Verlee Monte, NP    Family History Family History  Problem Relation Age of Onset   Diabetes Mother    Hypertension Mother    Rheum arthritis Mother    Hypothyroidism Mother        Hashimoto's   Obesity Mother    Diabetes Maternal Grandmother    Heart disease Maternal Grandmother    Cancer Maternal Grandfather        LUNG   Diabetes Paternal Grandmother    Heart disease Paternal Grandfather  Chromosomal disorder Son        Trisomy 18/ oomphalacele/ stillborn    Social History Social History   Tobacco Use   Smoking status: Former   Smokeless tobacco: Never  Building services engineer Use: Never used  Substance Use Topics   Alcohol use: No   Drug use: No     Allergies   Other   Review of Systems Review of Systems  HENT:  Negative for facial swelling, trouble swallowing and voice change.   Skin:  Positive for rash.  Neurological:  Negative for weakness.  Hematological:  Positive for adenopathy (pre-auricular).     Physical Exam Triage Vital Signs ED Triage Vitals [11/15/21 1546]  Enc Vitals Group     BP 118/77     Pulse Rate 89     Resp 18     Temp 98.2 F (36.8 C)     Temp Source Oral     SpO2 99 %     Weight      Height      Head Circumference      Peak Flow      Pain Score      Pain Loc      Pain Edu?      Excl. in GC?    No data found.  Updated Vital Signs BP 118/77 (BP Location: Left Arm)   Pulse 89    Temp 98.2 F (36.8 C) (Oral)   Resp 18   SpO2 99%      Physical Exam Vitals and nursing note reviewed.  Constitutional:      General: She is not in acute distress.    Appearance: Normal appearance. She is not ill-appearing or toxic-appearing.  HENT:     Head: Normocephalic and atraumatic.     Nose: Nose normal.     Mouth/Throat:     Mouth: Mucous membranes are moist.     Pharynx: Oropharynx is clear.  Eyes:     General: No scleral icterus.       Right eye: No discharge.        Left eye: No discharge.     Conjunctiva/sclera: Conjunctivae normal.  Neck:     Comments: Enlarged pre-auricular lymph nodes bilaterally Cardiovascular:     Rate and Rhythm: Normal rate and regular rhythm.  Pulmonary:     Effort: Pulmonary effort is normal. No respiratory distress.  Musculoskeletal:     Cervical back: Neck supple.  Skin:    General: Skin is dry.     Findings: Rash (vesicular clusters on mid-frontal region and bridge of nose. TTP) present.  Neurological:     General: No focal deficit present.     Mental Status: She is alert. Mental status is at baseline.     Motor: No weakness.     Gait: Gait normal.  Psychiatric:        Mood and Affect: Mood normal.        Behavior: Behavior normal.        Thought Content: Thought content normal.      UC Treatments / Results  Labs (all labs ordered are listed, but only abnormal results are displayed) Labs Reviewed - No data to display  EKG   Radiology No results found.  Procedures Procedures (including critical care time)  Medications Ordered in UC Medications - No data to display  Initial Impression / Assessment and Plan / UC Course  I have reviewed the triage vital signs and the nursing notes.  Pertinent labs & imaging results that were available during my care of the patient were reviewed by me and considered in my medical decision making (see chart for details).   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  She has a history of vesicular rash yearly.  Has been seen in 2017 for this and had the vesicular fluid tested which was positive for HSV 1.  I was able to review this result in the chart.  Sent Valtrex to pharmacy.  Reviewed supportive care.  Work note provided.  Follow-up as needed.   Final Clinical Impressions(s) / UC Diagnoses   Final diagnoses:  Vesicular rash   Discharge Instructions   None    ED Prescriptions     Medication Sig Dispense Auth. Provider   valACYclovir (VALTREX) 1000 MG tablet Take 1 tablet (1,000 mg total) by mouth 3 (three) times daily for 7 days. 21 tablet Shirlee Latch, PA-C      PDMP not reviewed this encounter.   Shirlee Latch, PA-C 11/15/21 1610

## 2021-12-03 ENCOUNTER — Ambulatory Visit
Admission: EM | Admit: 2021-12-03 | Discharge: 2021-12-03 | Disposition: A | Discharge status: Home / Self Care | Payer: Medicaid Other | Attending: Internal Medicine | Admitting: Internal Medicine

## 2021-12-03 DIAGNOSIS — Z111 Encounter for screening for respiratory tuberculosis: Secondary | ICD-10-CM

## 2021-12-03 MED ORDER — TUBERCULIN PPD 5 UNIT/0.1ML ID SOLN
5.0000 [IU] | Freq: Once | INTRADERMAL | Status: DC
  Administered 2021-12-03: 5 [IU] via INTRADERMAL

## 2021-12-03 NOTE — ED Triage Notes (Signed)
Pt presents to [facility] for PPD placement for work. Pt is aware she will need to return in 48-72 hours to have test read.

## 2021-12-03 NOTE — Discharge Instructions (Signed)
Please return to [facility] urgent care in 48-72 hours to have TB skin test read.

## 2021-12-20 ENCOUNTER — Ambulatory Visit: Payer: Medicaid Other | Admitting: Obstetrics and Gynecology

## 2021-12-20 ENCOUNTER — Encounter: Payer: Self-pay | Admitting: Obstetrics and Gynecology

## 2021-12-20 ENCOUNTER — Ambulatory Visit: Payer: Medicaid Other

## 2021-12-20 ENCOUNTER — Other Ambulatory Visit (HOSPITAL_COMMUNITY)
Admission: RE | Admit: 2021-12-20 | Discharge: 2021-12-20 | Disposition: A | Discharge status: Home / Self Care | Payer: Medicaid Other | Source: Ambulatory Visit | Attending: Obstetrics and Gynecology | Admitting: Obstetrics and Gynecology

## 2021-12-20 VITALS — BP 114/60 | Ht 66.0 in | Wt 253.0 lb

## 2021-12-20 DIAGNOSIS — Z30431 Encounter for routine checking of intrauterine contraceptive device: Secondary | ICD-10-CM | POA: Diagnosis not present

## 2021-12-20 DIAGNOSIS — Z124 Encounter for screening for malignant neoplasm of cervix: Secondary | ICD-10-CM

## 2021-12-20 DIAGNOSIS — Z113 Encounter for screening for infections with a predominantly sexual mode of transmission: Secondary | ICD-10-CM | POA: Insufficient documentation

## 2021-12-20 DIAGNOSIS — R1032 Left lower quadrant pain: Secondary | ICD-10-CM

## 2021-12-20 NOTE — Progress Notes (Signed)
[place], [facility]   Chief Complaint  Patient presents with   Vaginal Bleeding    Blood only when wipes, pelvic pain in entire area x 2-3 days    HPI:      Ms. Misty Collins is a 26 y.o. G3P1101 whose LMP was No LMP recorded. (Menstrual status: IUD)., presents today for blood with wiping for the past 2-3 days. Has been having pelvic cramping and sharp LLQ pains, intermittently for past 2-3 days. Taking midol without relief. Never had these sx before. Also feeling dizzy/faint with LLQ pains. Has had some nausea with pain, no vomiting/diarrhea. No urin, vag sx, no fevers. Pt with IUD, Mirena placed 12/19. Has been amenorrheic, doesn't get BTB/dysmen.  She is sexually active with female partner; no new partners. No recent pap/STD testing.   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    Past Surgical History:  Procedure Laterality Date   DILATION AND EVACUATION N/A 04/28/2018   Procedure: DILATATION AND EVACUATION;  Surgeon: Natale Milch, MD;  Location: [facility];  Service: Gynecology;  Laterality: N/A;   INTRAUTERINE DEVICE (IUD) INSERTION N/A 04/28/2018   Procedure: INTRAUTERINE DEVICE (IUD) INSERTION;  Surgeon: Natale Milch, MD;  Location: [facility];  Service: Gynecology;  Laterality: N/A;   WISDOM TOOTH EXTRACTION      Family History  Problem Relation Age of Onset   Diabetes Mother    Hypertension Mother    Rheum arthritis Mother    Hypothyroidism Mother        Hashimoto's   Obesity Mother    Diabetes Maternal Grandmother    Heart disease Maternal Grandmother    Cancer Maternal Grandfather        LUNG   Diabetes Paternal Grandmother    Heart disease Paternal Grandfather     Chromosomal disorder Son        Trisomy 18/ oomphalacele/ stillborn    Social History   Socioeconomic History   Marital status: Significant Other    Spouse name: Koleen Nimrod   Number of children: 1   Years of education: 11th   Highest education level: Not on file  Occupational History   Occupation: stay at home mom  Tobacco Use   Smoking status: Former   Smokeless tobacco: Never  Building services engineer Use: Never used  Substance and Sexual Activity   Alcohol use: No   Drug use: No   Sexual activity: Yes    Birth control/protection: I.U.D.    Comment: Mirena- same sex partner  Other Topics Concern   Not on file  Social History Narrative   Not on file   Social Determinants of Health   Financial Resource Strain: Not on file  Food Insecurity: Not on file  Transportation Needs: Not on file  Physical Activity: Not on file  Stress: Not on file  Social Connections: Not on file  Intimate Partner Violence: Not on file    Outpatient Medications Prior to Visit  Medication Sig Dispense Refill   levonorgestrel (MIRENA) 20 MCG/24HR IUD 1 each by Intrauterine route once. Dec 2019 at [facility]     predniSONE (DELTASONE) 20 MG tablet Take 2 tablets (40 mg total) by mouth  daily. 8 tablet 0   No facility-administered medications prior to visit.      ROS:  Review of Systems  Constitutional:  Negative for fever.  Gastrointestinal:  Negative for blood in stool, constipation, diarrhea, nausea and vomiting.  Genitourinary:  Positive for pelvic pain and vaginal bleeding. Negative for dyspareunia, dysuria, flank pain, frequency, hematuria, urgency, vaginal discharge and vaginal pain.  Musculoskeletal:  Negative for back pain.  Skin:  Negative for rash.   BREAST: No symptoms   OBJECTIVE:   Vitals:  BP 114/60   Ht 5\' 6"  (1.676 m)   Wt 253 lb (114.8 kg)   BMI 40.84 kg/m   Physical Exam Vitals reviewed.  Constitutional:      Appearance: She is well-developed.  Pulmonary:     Effort:  Pulmonary effort is normal.  Genitourinary:    General: Normal vulva.     Pubic Area: No rash.      Labia:        Right: No rash, tenderness or lesion.        Left: No rash, tenderness or lesion.      Vagina: Normal. No vaginal discharge, erythema or tenderness.     Cervix: Cervical motion tenderness present.     Uterus: Normal. Tender. Not enlarged.      Adnexa:        Right: Tenderness present. No mass.         Left: Tenderness present. No mass.       Comments: IUD STRINGS IN CX OS Musculoskeletal:        General: Normal range of motion.     Cervical back: Normal range of motion.  Skin:    General: Skin is warm and dry.  Neurological:     General: No focal deficit present.     Mental Status: She is alert and oriented to person, place, and time.  Psychiatric:        Mood and Affect: Mood normal.        Behavior: Behavior normal.        Thought Content: Thought content normal.        Judgment: Judgment normal.     Results:  MRN: [id number] REPORT   Location: [facility] Date of Service: 12/20/2021  LMP: unknown       Indications:Pelvic Pain with IUD Findings:  The uterus is anteverted and measures 7.4 x 5.9 x 3.8 cm. Echo texture is homogenous without evidence of focal masses.   The Endometrium measures 2.7 mm. IUD appears to be in the correct location with both arms extended.   Right Ovary measures 3.1 x 2.4 x 1.8 cm. It is normal in appearance. Left Ovary measures 2.6 x 2.1 x 1.8 cm. It is normal in appearance. Survey of the adnexa demonstrates no adnexal masses. There is no free fluid in the cul de sac.   Impression: 1. Normal pelvic ultrasound.   Recommendations: 1.Clinical correlation with the patient's History and Physical Exam.    02/19/2022, RDMS, RVT    Assessment/Plan: LLQ pain - Plan: [facility] PELVIS TRANSVAGINAL NON-OB (TV ONLY); tender on exam, neg GYN u/s. Rule out STDs. If neg and sx resolve, most  likely due to menses with IUD/reassurance. If sx persist and STD testing neg, will treat for PID. Pt to f/u prn sx.   Screening for STD (sexually transmitted disease) - Plan: Cytology - PAP  Encounter for routine checking of intrauterine contraceptive device (IUD) - Plan: US PELVIS TRANSVAGINAL  NON-OB (TV ONLY); IUD in correct location.  Cervical cancer screening - Plan: Cytology - PAP    Return if symptoms worsen or fail to improve.  Imelda Dandridge B. Hunt Zajicek, PA-C 12/20/2021 3:57 PM

## 2021-12-26 LAB — CYTOLOGY - PAP
Chlamydia: NEGATIVE
Comment: NEGATIVE
Comment: NEGATIVE
Comment: NORMAL
Diagnosis: UNDETERMINED — AB
High risk HPV: POSITIVE — AB
Neisseria Gonorrhea: NEGATIVE

## 2022-01-01 ENCOUNTER — Ambulatory Visit (LOCAL_COMMUNITY_HEALTH_CENTER): Payer: Self-pay

## 2022-01-01 DIAGNOSIS — Z111 Encounter for screening for respiratory tuberculosis: Secondary | ICD-10-CM

## 2022-01-04 ENCOUNTER — Ambulatory Visit (LOCAL_COMMUNITY_HEALTH_CENTER): Payer: Self-pay

## 2022-01-04 DIAGNOSIS — Z111 Encounter for screening for respiratory tuberculosis: Secondary | ICD-10-CM

## 2022-01-04 LAB — TB SKIN TEST
Induration: 0 mm
TB Skin Test: NEGATIVE

## 2022-09-04 ENCOUNTER — Telehealth: Payer: Self-pay

## 2022-09-04 MED ORDER — RYBELSUS 7 MG PO TABS
7.0000 mg | ORAL_TABLET | Freq: Every morning | ORAL | 5 refills | Status: AC
Start: 2022-09-04 — End: ?

## 2022-09-04 NOTE — Telephone Encounter (Signed)
Patient called requesting rx for Rybelsus  to be sent to [facility]

## 2022-09-04 NOTE — Addendum Note (Signed)
Addended by: Grayling Congress on: 09/04/2022 03:13 PM   Modules accepted: Orders

## 2022-09-11 ENCOUNTER — Telehealth: Payer: Self-pay

## 2022-09-11 NOTE — Telephone Encounter (Signed)
Patient called regarding Rybelsus. Told her you sent it to [facility] in [place] on 09/04/22.

## 2022-09-11 NOTE — Telephone Encounter (Signed)
Patient called back and the pharmacy is waiting on prior authorization. Please complete this ASAP.

## 2022-09-25 ENCOUNTER — Ambulatory Visit: Payer: Medicaid Other | Admitting: Licensed Practical Nurse

## 2022-09-30 ENCOUNTER — Ambulatory Visit: Payer: Medicaid Other | Admitting: Obstetrics

## 2022-11-27 ENCOUNTER — Emergency Department: Payer: Medicaid Other

## 2022-11-27 ENCOUNTER — Other Ambulatory Visit: Payer: Self-pay

## 2022-11-27 ENCOUNTER — Encounter: Payer: Self-pay | Admitting: Emergency Medicine

## 2022-11-27 ENCOUNTER — Emergency Department
Admission: EM | Admit: 2022-11-27 | Discharge: 2022-11-27 | Discharge status: Against Medical Advice | Payer: Medicaid Other | Attending: Emergency Medicine | Admitting: Emergency Medicine

## 2022-11-27 DIAGNOSIS — E876 Hypokalemia: Secondary | ICD-10-CM | POA: Diagnosis not present

## 2022-11-27 DIAGNOSIS — Z975 Presence of (intrauterine) contraceptive device: Secondary | ICD-10-CM | POA: Diagnosis not present

## 2022-11-27 DIAGNOSIS — R102 Pelvic and perineal pain: Secondary | ICD-10-CM | POA: Insufficient documentation

## 2022-11-27 LAB — COMPREHENSIVE METABOLIC PANEL
ALT: 11 U/L (ref 0–44)
AST: 16 U/L (ref 15–41)
Albumin: 4.4 g/dL (ref 3.5–5.0)
Alkaline Phosphatase: 52 U/L (ref 38–126)
Anion gap: 9 (ref 5–15)
BUN: 7 mg/dL (ref 6–20)
CO2: 21 mmol/L — ABNORMAL LOW (ref 22–32)
Calcium: 9 mg/dL (ref 8.9–10.3)
Chloride: 105 mmol/L (ref 98–111)
Creatinine, Ser: 0.68 mg/dL (ref 0.44–1.00)
GFR, Estimated: >60 mL/min (ref >60)
Glucose, Bld: 96 mg/dL (ref 70–99)
Potassium: 3.4 mmol/L — ABNORMAL LOW (ref 3.5–5.1)
Sodium: 135 mmol/L (ref 135–145)
Total Bilirubin: 0.9 mg/dL (ref 0.3–1.2)
Total Protein: 7 g/dL (ref 6.5–8.1)

## 2022-11-27 LAB — CBC
HCT: 37 % (ref 36.0–46.0)
Hemoglobin: 12.8 g/dL (ref 12.0–15.0)
MCH: 28.4 pg (ref 26.0–34.0)
MCHC: 34.6 g/dL (ref 30.0–36.0)
MCV: 82.2 fL (ref 80.0–100.0)
Platelets: 229 10*3/uL (ref 150–400)
RBC: 4.5 MIL/uL (ref 3.87–5.11)
RDW: 12.6 % (ref 11.5–15.5)
WBC: 7.4 10*3/uL (ref 4.0–10.5)
nRBC: 0 % (ref 0.0–0.2)

## 2022-11-27 LAB — URINALYSIS, ROUTINE W REFLEX MICROSCOPIC
Bilirubin Urine: NEGATIVE
Glucose, UA: NEGATIVE mg/dL
Hgb urine dipstick: NEGATIVE
Ketones, ur: NEGATIVE mg/dL
Nitrite: NEGATIVE
Protein, ur: NEGATIVE mg/dL
Specific Gravity, Urine: 1.019 (ref 1.005–1.030)
pH: 5 (ref 5.0–8.0)

## 2022-11-27 LAB — LIPASE, BLOOD: Lipase: 37 U/L (ref 11–51)

## 2022-11-27 LAB — POC URINE PREG, ED: Preg Test, Ur: NEGATIVE

## 2022-11-27 NOTE — ED Provider Notes (Signed)
[facility] Provider Note  Patient Contact: 5:57 PM (approximate)   History   Pelvic Pain   HPI  Misty Collins is a 27 y.o. female with a history of prior UTIs, dysmenorrhea, anxiety and pyelonephritis, presents to the emergency department with pelvic pain.  Patient is convinced that her IUD is causing her discomfort which has been in place for 5 years.  Patient reports that her pelvic pain is so severe, she is having difficulty sitting down.  No nausea or vomiting.  No diarrhea or fever.  No chest pain, chest tightness or shortness of breath.      Physical Exam   Triage Vital Signs: ED Triage Vitals  Enc Vitals Group     BP 11/27/22 1700 125/79     Pulse Rate 11/27/22 1700 89     Resp 11/27/22 1700 18     Temp 11/27/22 1700 98.7 F (37.1 C)     Temp Source 11/27/22 1700 Oral     SpO2 11/27/22 1700 97 %     Weight --      Height --      Head Circumference --      Peak Flow --      Pain Score 11/27/22 1659 6     Pain Loc --      Pain Edu? --      Excl. in GC? --     Most recent vital signs: Vitals:   11/27/22 1700  BP: 125/79  Pulse: 89  Resp: 18  Temp: 98.7 F (37.1 C)  SpO2: 97%     General: Alert and in no acute distress. Eyes:  PERRL. EOMI. Head: No acute traumatic findings ENT:      Nose: No congestion/rhinnorhea.      Mouth/Throat: Mucous membranes are moist. Neck: No stridor. No cervical spine tenderness to palpation. Cardiovascular:  Good peripheral perfusion Respiratory: Normal respiratory effort without tachypnea or retractions. Lungs CTAB. Good air entry to the bases with no decreased or absent breath sounds. Gastrointestinal: Bowel sounds 4 quadrants. Soft and nontender to palpation. No guarding or rigidity. No palpable masses. No distention. No CVA tenderness. Musculoskeletal: Full range of motion to all extremities.  Neurologic:  No gross focal neurologic deficits are appreciated.  Skin:   No rash  noted    ED Results / Procedures / Treatments   Labs (all labs ordered are listed, but only abnormal results are displayed) Labs Reviewed  COMPREHENSIVE METABOLIC PANEL - Abnormal; Notable for the following components:      Result Value   Potassium 3.4 (*)    CO2 21 (*)    All other components within normal limits  URINALYSIS, ROUTINE W REFLEX MICROSCOPIC - Abnormal; Notable for the following components:   Color, Urine YELLOW (*)    APPearance CLOUDY (*)    Leukocytes,Ua SMALL (*)    Bacteria, UA RARE (*)    All other components within normal limits  LIPASE, BLOOD  CBC  POC URINE PREG, ED         PROCEDURES:  Critical Care performed: No  Procedures   MEDICATIONS ORDERED IN ED: Medications - No data to display   IMPRESSION / MDM / ASSESSMENT AND PLAN / ED COURSE  I reviewed the triage vital signs and the nursing notes.                              Assessment and plan: Pelvic  pain:   27 year old female presents to the emergency department with severe pelvic pain.  Vital signs are reassuring at triage.  CBC reassuring.  CMP indicates very mild hypokalemia but otherwise unremarkable.  Urinalysis consistent with a dirty catch.  Lipase within reference range.  Urine pregnancy test was negative.  Anticipate getting a pelvic ultrasound for patient given her chief complaint but patient eloped from the emergency department.   FINAL CLINICAL IMPRESSION(S) / ED DIAGNOSES   Final diagnoses:  Pain in female pelvis  IUD (intrauterine device) in place     Rx / DC Orders   ED Discharge Orders     None        Note:  This document was prepared using Dragon voice recognition software and may include unintentional dictation errors.   Pia Mau Asbury, PA-C 11/27/22 Carlis Stable    Pilar Jarvis, MD 11/27/22 Mikle Bosworth

## 2022-11-27 NOTE — ED Notes (Signed)
Patient ambulated with a steady gait to the hallway restroom.

## 2022-11-27 NOTE — ED Triage Notes (Signed)
Patient to ED via POV for pelvic pain that radiates up into abd. Patient states it is her IUD- had for 4-5 years and had pain with intercourse but having constant pain for the past 24 hours.

## 2022-12-04 ENCOUNTER — Ambulatory Visit: Payer: Medicaid Other | Admitting: Obstetrics

## 2023-03-27 ENCOUNTER — Emergency Department: Payer: Medicaid Other

## 2023-03-27 ENCOUNTER — Other Ambulatory Visit: Payer: Self-pay

## 2023-03-27 ENCOUNTER — Encounter: Payer: Self-pay | Admitting: Emergency Medicine

## 2023-03-27 ENCOUNTER — Emergency Department
Admission: EM | Admit: 2023-03-27 | Discharge: 2023-03-27 | Disposition: A | Discharge status: Home / Self Care | Payer: Medicaid Other | Attending: Emergency Medicine | Admitting: Emergency Medicine

## 2023-03-27 DIAGNOSIS — R509 Fever, unspecified: Secondary | ICD-10-CM | POA: Diagnosis present

## 2023-03-27 DIAGNOSIS — N83201 Unspecified ovarian cyst, right side: Secondary | ICD-10-CM | POA: Diagnosis not present

## 2023-03-27 LAB — URINALYSIS, ROUTINE W REFLEX MICROSCOPIC
Bilirubin Urine: NEGATIVE
Glucose, UA: NEGATIVE mg/dL
Hgb urine dipstick: NEGATIVE
Ketones, ur: 80 mg/dL — AB
Nitrite: NEGATIVE
Protein, ur: 100 mg/dL — AB
Specific Gravity, Urine: 1.03 (ref 1.005–1.030)
Squamous Epithelial / HPF: >50 /[HPF] (ref 0–5)
pH: 5 (ref 5.0–8.0)

## 2023-03-27 LAB — COMPREHENSIVE METABOLIC PANEL
ALT: 17 U/L (ref 0–44)
AST: 20 U/L (ref 15–41)
Albumin: 4.7 g/dL (ref 3.5–5.0)
Alkaline Phosphatase: 61 U/L (ref 38–126)
Anion gap: 13 (ref 5–15)
BUN: 12 mg/dL (ref 6–20)
CO2: 19 mmol/L — ABNORMAL LOW (ref 22–32)
Calcium: 9.3 mg/dL (ref 8.9–10.3)
Chloride: 105 mmol/L (ref 98–111)
Creatinine, Ser: 0.64 mg/dL (ref 0.44–1.00)
GFR, Estimated: >60 mL/min (ref >60)
Glucose, Bld: 92 mg/dL (ref 70–99)
Potassium: 3.5 mmol/L (ref 3.5–5.1)
Sodium: 137 mmol/L (ref 135–145)
Total Bilirubin: 1 mg/dL (ref <1.2)
Total Protein: 7.9 g/dL (ref 6.5–8.1)

## 2023-03-27 LAB — CBC
HCT: 41.7 % (ref 36.0–46.0)
Hemoglobin: 14.1 g/dL (ref 12.0–15.0)
MCH: 28.5 pg (ref 26.0–34.0)
MCHC: 33.8 g/dL (ref 30.0–36.0)
MCV: 84.4 fL (ref 80.0–100.0)
Platelets: 295 10*3/uL (ref 150–400)
RBC: 4.94 MIL/uL (ref 3.87–5.11)
RDW: 12.5 % (ref 11.5–15.5)
WBC: 9.9 10*3/uL (ref 4.0–10.5)
nRBC: 0 % (ref 0.0–0.2)

## 2023-03-27 LAB — POC URINE PREG, ED: Preg Test, Ur: NEGATIVE — NL

## 2023-03-27 LAB — LIPASE, BLOOD: Lipase: 56 U/L — ABNORMAL HIGH (ref 11–51)

## 2023-03-27 MED ORDER — SODIUM CHLORIDE 0.9 % IV BOLUS
1000.0000 mL | Freq: Once | INTRAVENOUS | Status: AC
  Administered 2023-03-27: 1000 mL via INTRAVENOUS

## 2023-03-27 MED ORDER — ONDANSETRON HCL 4 MG/2ML IJ SOLN
4.0000 mg | Freq: Once | INTRAMUSCULAR | Status: AC
  Administered 2023-03-27: 4 mg via INTRAVENOUS
  Filled 2023-03-27: qty 2

## 2023-03-27 MED ORDER — OXYCODONE HCL 5 MG PO TABS: 5.0000 mg | ORAL_TABLET | Freq: Four times a day (QID) | ORAL | 0 refills | Status: AC | PRN

## 2023-03-27 MED ORDER — ONDANSETRON 4 MG PO TBDP: 4.0000 mg | ORAL_TABLET | Freq: Three times a day (TID) | ORAL | 0 refills | Status: AC | PRN

## 2023-03-27 MED ORDER — IOHEXOL 300 MG/ML  SOLN
100.0000 mL | Freq: Once | INTRAMUSCULAR | Status: AC | PRN
  Administered 2023-03-27: 100 mL via INTRAVENOUS

## 2023-03-27 MED ORDER — HYDROMORPHONE HCL 1 MG/ML IJ SOLN
0.5000 mg | Freq: Once | INTRAMUSCULAR | Status: AC
  Administered 2023-03-27: 0.5 mg via INTRAVENOUS
  Filled 2023-03-27: qty 0.5

## 2023-03-27 MED ORDER — OXYCODONE HCL 5 MG PO TABS
5.0000 mg | ORAL_TABLET | Freq: Once | ORAL | Status: AC
  Administered 2023-03-27: 5 mg via ORAL
  Filled 2023-03-27: qty 1

## 2023-03-27 MED ORDER — KETOROLAC TROMETHAMINE 15 MG/ML IJ SOLN
15.0000 mg | Freq: Once | INTRAMUSCULAR | Status: AC
  Administered 2023-03-27: 15 mg via INTRAVENOUS
  Filled 2023-03-27: qty 1

## 2023-03-27 NOTE — ED Triage Notes (Signed)
Pt via POV from home. Pt c/o RLQ pain and nausea reports that she was send it from [facility] for possible appendicitis. Pain and nausea started yesterday at 0330. Denies fever. Denies hx of abd surgeries.

## 2023-03-27 NOTE — ED Provider Notes (Signed)
[facility] Provider Note    Event Date/Time   First MD Initiated Contact with Patient 03/27/23 1606     (approximate)   History   Abdominal Pain   HPI  Misty Collins is a 27 y.o. female with history of a right ovarian cyst who comes in with concerns for severe right-sided pain.  Patient reports having some discomfort that started yesterday.  She reports that it was associate with some fevers at home, nausea, vomiting.  She reports that she went to an urgent care and was told that she needed to come here to rule out appendicitis.  She denies any prior history of abdominal surgeries.  She did just have her IUD removed last month.   Physical Exam   Triage Vital Signs: ED Triage Vitals  Encounter Vitals Group     BP 03/27/23 1329 121/68     Systolic BP Percentile --      Diastolic BP Percentile --      Pulse Rate 03/27/23 1329 (!) 54     Resp 03/27/23 1329 18     Temp 03/27/23 1329 97.9 F (36.6 C)     Temp src --      SpO2 03/27/23 1329 99 %     Weight 03/27/23 1328 208 lb (94.3 kg)     Height 03/27/23 1328 5\' 6"  (1.676 m)     Head Circumference --      Peak Flow --      Pain Score 03/27/23 1328 6     Pain Loc --      Pain Education --      Exclude from Growth Chart --     Most recent vital signs: Vitals:   03/27/23 1329  BP: 121/68  Pulse: (!) 54  Resp: 18  Temp: 97.9 F (36.6 C)  SpO2: 99%     General: Awake, no distress.  CV:  Good peripheral perfusion.  Resp:  Normal effort.  Abd:  No distention.  Tender in the right lower quadrant Other:     ED Results / Procedures / Treatments   Labs (all labs ordered are listed, but only abnormal results are displayed) Labs Reviewed  LIPASE, BLOOD - Abnormal; Notable for the following components:      Result Value   Lipase 56 (*)    All other components within normal limits  COMPREHENSIVE METABOLIC PANEL - Abnormal; Notable for the following components:   CO2 19 (*)    All  other components within normal limits  URINALYSIS, ROUTINE W REFLEX MICROSCOPIC - Abnormal; Notable for the following components:   Color, Urine YELLOW (*)    APPearance TURBID (*)    Ketones, ur 80 (*)    Protein, ur 100 (*)    Leukocytes,Ua MODERATE (*)    Bacteria, UA FEW (*)    All other components within normal limits  POC URINE PREG, ED - Abnormal  CBC     RADIOLOGY I have reviewed the CT personally interpreted normal appendix  PROCEDURES:  Critical Care performed: No  Procedures   MEDICATIONS ORDERED IN ED: Medications  HYDROmorphone (DILAUDID) injection 0.5 mg (has no administration in time range)  ondansetron (ZOFRAN) injection 4 mg (has no administration in time range)  sodium chloride 0.9 % bolus 1,000 mL (has no administration in time range)     IMPRESSION / MDM / ASSESSMENT AND PLAN / ED COURSE  I reviewed the triage vital signs and the nursing notes.   Patient's  presentation is most consistent with acute presentation with potential threat to life or bodily function.   Patient comes in with severe right lower quadrant pain.  We discussed different imaging modalities given I have stronger suspicion for ovarian torsion given she is afebrile with normal white count however patient would like to get the CT scan in conjunction to help speed diagnosis..  Was given some IV Dilaudid IV Zofran   IMPRESSION: Normal appendix.  No bowel obstruction, free air or free fluid.   Fatty liver infiltration.   Gallstone  IMPRESSION: Few tiny cystic areas which are subendometrial. Please correlate for other findings of adenomyosis.   No significant free fluid in the pelvis. 13 mm right-sided ovarian dominant follicle or small hemorrhagic cyst. No specific imaging follow-up. Grossly preserved blood flow to the ovaries on color and spectral Doppler.  Discussed with OB/GYN Paula Compton who discussed with Dr. Logan Bores.  Recommended pain control, nausea control and  outpatient follow-up with OB/GYN.  Discussed with patient the incidental findings.  She has no right upper quadrant tenderness to suggest this being gallstones but she is aware what to look out for in regards to this type of pain.  On repeat assessment patient is feeling better she is tolerating p.o.  She feels comfortable with discharge home.  We discussed STD screening and she declined stating she just had it done recently denies any new partners.  We discussed not driving while on the opioids.  She expressed understanding and felt comfortable with discharge home  FINAL CLINICAL IMPRESSION(S) / ED DIAGNOSES   Final diagnoses:  Hemorrhagic cyst of right ovary     Rx / DC Orders   ED Discharge Orders          Ordered    ondansetron (ZOFRAN-ODT) 4 MG disintegrating tablet  Every 8 hours PRN        03/27/23 2028    oxyCODONE (ROXICODONE) 5 MG immediate release tablet  Every 6 hours PRN        03/27/23 2028             Note:  This document was prepared using Dragon voice recognition software and may include unintentional dictation errors.   Concha Se, MD 03/27/23 2028

## 2023-03-27 NOTE — Discharge Instructions (Addendum)
Do not take opioids with the alprazolam   Take Tylenol 1 g every 8 hours, ibuprofen 600 every 6-8 hours with food and use the oxycodone for breakthrough pain.  Do not drive or work while on this.   Please call the OB/GYN to make a follow-up appointmen  Return to the ER for worsening symptoms or any other concerns  IMPRESSION: Normal appendix.  No bowel obstruction, free air or free fluid.   Fatty liver infiltration.   Gallstone  IMPRESSION: Few tiny cystic areas which are subendometrial. Please correlate for other findings of adenomyosis.   No significant free fluid in the pelvis. 13 mm right-sided ovarian dominant follicle or small hemorrhagic cyst. No specific imaging follow-up. Grossly preserved blood flow to the ovaries on color and spectral Doppler.   Take oxycodone as prescribed. Do not drink alcohol, drive or participate in any other potentially dangerous activities while taking this medication as it may make you sleepy. Do not take this medication with any other sedating medications, either prescription or over-the-counter. If you were prescribed Percocet or Vicodin, do not take these with acetaminophen (Tylenol) as it is already contained within these medications.  This medication is an opiate (or narcotic) pain medication and can be habit forming. Use it as little as possible to achieve adequate pain control. Do not use or use it with extreme caution if you have a history of opiate abuse or dependence. If you are on a pain contract with your primary care doctor or a pain specialist, be sure to let them know you were prescribed this medication today from the Emergency Department. This medication is intended for your use only - do not give any to anyone else and keep it in a secure place where nobody else, especially children, have access to it.

## 2023-04-11 ENCOUNTER — Ambulatory Visit: Payer: Medicaid Other | Admitting: Surgery

## 2023-04-21 ENCOUNTER — Ambulatory Visit: Payer: Medicaid Other | Admitting: Surgery

## 2023-04-23 ENCOUNTER — Encounter: Payer: Self-pay | Admitting: Surgery

## 2023-04-23 ENCOUNTER — Ambulatory Visit (INDEPENDENT_AMBULATORY_CARE_PROVIDER_SITE_OTHER): Payer: Medicaid Other | Admitting: Surgery

## 2023-04-23 VITALS — BP 99/62 | HR 94 | Temp 98.0°F | Ht 66.0 in | Wt 210.0 lb

## 2023-04-23 DIAGNOSIS — K8020 Calculus of gallbladder without cholecystitis without obstruction: Secondary | ICD-10-CM | POA: Diagnosis not present

## 2023-04-23 NOTE — Progress Notes (Signed)
04/23/2023  Reason for Visit: Symptomatic cholelithiasis  Requesting Provider: Rolin Barry, MD  History of Present Illness: Misty Collins is a 27 y.o. female presenting for evaluation of symptomatic cholelithiasis.  The patient reports a 1 year history of right upper quadrant abdominal pain including epigastric location which would radiate to the back.  The patient reports that the pain was very intermittent at first.  She then started using Ozempic for weight loss and started having worsening issues particularly developing pancreatitis.  After stopping Ozempic, she started taking Rybelsus which has been helping for weight management but she started getting some more worsening issues with the right upper quadrant pain.  She presented to the emergency room on 03/27/2023 due to her pain which was associated with nausea and vomiting.  She had a CT scan of the abdomen pelvis which showed cholelithiasis with potentially 1 single stone measuring up to 2 cm in size.  She has been off of Rybelsus for about a month but she continues having about once weekly episode of right upper quadrant abdominal pain.  This can happen after meals but also outside of mealtime.  Denies any fevers, chills, chest pain, shortness of breath.  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, IUFD   Headache    Iron deficiency anemia    Obesity    Pyelonephritis affecting pregnancy 12/2011   Urinary tract infection      Past Surgical History: Past Surgical History:  Procedure Laterality Date   DILATION AND EVACUATION N/A 04/28/2018   Procedure: DILATATION AND EVACUATION;  Surgeon: Natale Milch, MD;  Location: [facility] ORS;  Service: Gynecology;  Laterality: N/A;   INTRAUTERINE DEVICE (IUD) INSERTION N/A 04/28/2018   Procedure:  INTRAUTERINE DEVICE (IUD) INSERTION;  Surgeon: Natale Milch, MD;  Location: [facility] ORS;  Service: Gynecology;  Laterality: N/A;   WISDOM TOOTH EXTRACTION      Home Medications: Prior to Admission medications   Medication Sig Start Date End Date Taking? Authorizing Provider  ALPRAZolam Prudy Feeler) 1 MG tablet Take 1 mg by mouth 2 (two) times daily as needed.   Yes [provider]  prazosin (MINIPRESS) 1 MG capsule Take 1 mg by mouth at bedtime.   Yes [provider]  valACYclovir (VALTREX) 1000 MG tablet Take 1,000 mg by mouth 3 (three) times daily.   Yes [provider]  fluticasone (FLONASE) 50 MCG/ACT nasal spray Place 2 sprays into both nostrils daily. Patient not taking: Reported on 04/23/2023 02/11/23 02/11/24  [provider]  Semaglutide (RYBELSUS) 7 MG TABS Take 1 tablet (7 mg total) by mouth every morning. At least 30 minutes prior to food, with 4 oz of water. Patient not taking: Reported on 04/23/2023 09/04/22   Miki Kins, FNP    Allergies: Allergies  Allergen Reactions   Other     Can not eat red meat    Social History:  reports that she has quit smoking. She has been exposed to tobacco smoke. She has never used smokeless tobacco. She reports that she does not drink alcohol and does not use drugs.   Family History: Family History  Problem Relation Age of Onset   Diabetes Mother    Hypertension Mother    Rheum arthritis Mother    Hypothyroidism Mother  Hashimoto's   Obesity Mother    Diabetes Maternal Grandmother    Heart disease Maternal Grandmother    Cancer Maternal Grandfather        LUNG   Diabetes Paternal Grandmother    Heart disease Paternal Grandfather    Chromosomal disorder Son        Trisomy 18/ oomphalacele/ stillborn    Review of Systems: Review of Systems  Constitutional:  Negative for chills and fever.  Respiratory:  Negative for shortness of breath.   Cardiovascular:  Negative for chest pain.   Gastrointestinal:  Positive for abdominal pain, nausea and vomiting.  Genitourinary:  Negative for dysuria.  Musculoskeletal:  Positive for back pain.    Physical Exam BP 99/62   Pulse 94   Temp 98 F (36.7 C)   Ht 5\' 6"  (1.676 m)   Wt 210 lb (95.3 kg)   LMP 04/04/2023 (Exact Date)   SpO2 98%   BMI 33.89 kg/m  CONSTITUTIONAL: No acute distress HEENT:  Normocephalic, atraumatic, extraocular motion intact. RESPIRATORY:  Lungs are clear, and breath sounds are equal bilaterally. Normal respiratory effort without pathologic use of accessory muscles. CARDIOVASCULAR: Heart is regular without murmurs, gallops, or rubs. GI: The abdomen is soft, nondistended, with some discomfort to palpation in the right upper quadrant and epigastric area.  Negative Murphy's sign.  MUSCULOSKELETAL:  Normal muscle strength and tone in all four extremities.  No peripheral edema or cyanosis. NEUROLOGIC:  Motor and sensation is grossly normal.  Cranial nerves are grossly intact. PSYCH:  Alert and oriented to person, place and time. Affect is normal.  Laboratory Analysis: Labs from 03/27/2023: Sodium 137, potassium 3.5, chloride 105, CO2 19, BUN 12, creatinine 0.64.  Total bilirubin 1.0, AST 20, ALT 17, alkaline phosphatase 61, lipase 56, albumin 4.7.  WBC 9.9, hemoglobin 14.1, hematocrit 41.7, platelets 295.  Imaging: CT abdomen/pelvis on 03/27/2023: IMPRESSION: Normal appendix.  No bowel obstruction, free air or free fluid.   Fatty liver infiltration.   Gallstone  Assessment and Plan: This is a 27 y.o. female with symptomatic cholelithiasis.  - Discussed with patient the findings on her CT scan imaging.  Discussed with her also how weight loss medications like Ozempic and Rybelsus can contribute to biliary issues particularly in the setting of cholestasis which can result in sludge and gallstone formation.  Cussed with patient how this can contribute to episodes of biliary colic and how these are  associated mostly with meals particularly greasy or fatty foods.  Discussed with the patient potential options for watchful waiting with dietary modification versus surgical management in the form of cholecystectomy.  At this point, the patient would prefer to avoid surgery if possible and she wants to explore dietary changes to continue helping with her weight loss journey.  We will give her information about gallbladder diet plan and she will follow-up with me in about 1 month to reassess how everything is going.  If her symptoms improve with dietary modifications, discussed with the patient that potentially we could avoid surgery but if her symptoms continue or progress more, it would be more recommended to proceed with surgery. - Patient is in agreement with this plan and all of her questions have been answered.  I spent 30 minutes dedicated to the care of this patient on the date of this encounter to include pre-visit review of records, face-to-face time with the patient discussing diagnosis and management, and any post-visit coordination of care.   Howie Ill, MD [facility]

## 2023-04-23 NOTE — Patient Instructions (Addendum)
Follow up as needed.   Gallbladder Eating Plan High blood cholesterol, obesity, a sedentary lifestyle, an unhealthy diet, and diabetes are risk factors for developing gallstones. If you have a gallbladder condition, you may have trouble digesting fats and tolerating high fat intake. Eating a low-fat diet can help reduce your symptoms and may be helpful before and after having surgery to remove your gallbladder (cholecystectomy). Your health care provider may recommend that you work with a dietitian to help you reduce the amount of fat in your diet. What are tips for following this plan? General guidelines Limit your fat intake to less than 30% of your total daily calories. If you eat around 1,800 calories each day, this means eating less than 60 grams (g) of fat per day. Fat is an important part of a healthy diet. Eating a low-fat diet can make it hard to maintain a healthy body weight. Ask your dietitian how much fat, calories, and other nutrients you need each day. Eat small, frequent meals throughout the day instead of three large meals. Drink at least 8-10 cups (1.9-2.4 L) of fluid a day. Drink enough fluid to keep your urine pale yellow. If you drink alcohol: Limit how much you have to: 0-1 drink a day for women who are not pregnant. 0-2 drinks a day for men. Know how much alcohol is in a drink. In the [place] one drink equals one 12 oz bottle of beer (355 mL), one 5 oz glass of wine (148 mL), or one 1 oz glass of hard liquor (44 mL). Reading food labels  Check nutrition facts on food labels for the amount of fat per serving. Choose foods with less than 3 grams of fat per serving. Shopping Choose nonfat and low-fat healthy foods. Look for the words "nonfat," "low-fat," or "fat-free." Avoid buying processed or prepackaged foods. Cooking Cook using low-fat methods, such as baking, broiling, grilling, or boiling. Cook with small amounts of healthy fats, such as olive oil, grapeseed oil,  canola oil, avocado oil, or sunflower oil. What foods are recommended?  All fresh, frozen, or canned fruits and vegetables. Whole grains. Low-fat or nonfat (skim) milk and yogurt. Lean meat, skinless poultry, fish, eggs, and beans. Low-fat protein supplement powders or drinks. Spices and herbs. The items listed above may not be a complete list of foods and beverages you can eat and drink. Contact a dietitian for more information. What foods are not recommended? High-fat foods. These include baked goods, fast food, fatty cuts of meat, ice cream, french toast, sweet rolls, pizza, cheese bread, foods covered with butter, creamy sauces, or cheese. Fried foods. These include french fries, tempura, battered fish, breaded chicken, fried breads, and sweets. Foods that cause bloating and gas. The items listed above may not be a complete list of foods that you should avoid. Contact a dietitian for more information. Summary A low-fat diet can be helpful if you have a gallbladder condition, or before and after gallbladder surgery. Limit your fat intake to less than 30% of your total daily calories. This is about 60 g of fat if you eat 1,800 calories each day. Eat small, frequent meals throughout the day instead of three large meals. This information is not intended to replace advice given to you by your health care provider. Make sure you discuss any questions you have with your health care provider. Document Revised: 04/20/2021 Document Reviewed: 04/20/2021 Elsevier Patient Education  2024 [employer]   Cholelithiasis   Cholelithiasis happens when gallstones form  in the gallbladder. The gallbladder stores bile. Bile is a fluid that helps digest fats. Bile can harden and form into gallstones. If they cause a blockage, they can cause pain (gallbladder attack). What are the causes? This condition may be caused by: Too much bilirubin in the bile. This happens if you have sickle cell anemia. Too  much of a fat-like substance (cholesterol) in your bile. Not enough bile salts in your bile. These salts help the body absorb and digest fats. The gallbladder not emptying fully or often enough. This is common in pregnant women. What increases the risk? The following factors may make you more likely to develop this condition: Being older than age 69. Eating a lot of fried foods, fat, and refined carbs (refined carbohydrates). Being female. Being pregnant many times. Using medicines with female hormones in them for a long time. Losing weight fast. Having gallstones in your family. Having health problems, such as diabetes, obesity, Crohn's disease, or liver disease. What are the signs or symptoms? Often, there may be gallstones but no symptoms. These gallstones are called silent gallstones. If a gallstone causes a blockage, you may get sudden pain. The pain: Can be in the upper right part of your belly (abdomen). Normally comes at night or after you eat. Can last an hour or more. Can spread to your right shoulder, back, or chest. Can feel like discomfort, burning, or fullness in the upper part of your belly (indigestion). If the blockage lasts more than a few hours, you can get an infection or swelling. You may: Vomit or feel like you may vomit (nauseous). Feel bloated. Have belly pain for 5 hours or more. Feel tender in your belly, often in the upper right part and under your ribs. Have a fever or chills. Have skin or the white parts of your eyes turn yellow (jaundice). Have dark pee (urine) or pale poop (stool). How is this treated? Treatment for this condition depends on how bad you feel. If you have symptoms, you may need: Home care, if symptoms are not very bad. Do not eat for 12-24 hours. Drink only water and clear liquids. After 1 or 2 days, start to eat simple or clear foods. Try broth and crackers. You may need medicines for pain or stomach upset or both. If you have an  infection, you will need antibiotics. A hospital stay, if you have very bad pain or a very bad infection. Surgery to remove your gallbladder. You may need this if: Gallstones keep coming back. You have very bad symptoms. Medicines to break up gallstones. Medicines may be used for 6-12 months. A procedure to find and take out gallstones or to break up gallstones. Follow these instructions at home: Medicines Take over-the-counter and prescription medicines only as told by your doctor. If you were prescribed antibiotics, take them as told by your doctor. Do not stop taking them even if you start to feel better. Ask your doctor if you should avoid driving or using machines while you are taking your medicine. Eating and drinking Drink enough fluid to keep your pee pale yellow. Drink water or clear fluids. This is important when you have pain. Eat healthy foods. Choose: Fewer fatty foods, such as fried foods. Fewer refined carbs. Avoid breads and grains that are highly processed, such as white bread and white rice. Choose whole grains, such as whole-wheat bread and brown rice. More fiber. Almonds, fresh fruit, and beans are healthy sources. General instructions Keep a healthy weight. Keep  all follow-up visits. You may need to see a specialist or a Careers adviser. Where to find more information [employer] Diabetes and Digestive [employer] Kidney Diseases: [email or web address] Contact a doctor if: You have sudden pain in the upper right part of your belly. Pain might spread to your right shoulder, back, or chest. Your pain lasts more than 2 hours. You have been diagnosed with gallstones that have no symptoms and you get: Belly pain. Discomfort, burning, or fullness in the upper part of your abdomen. You keep feeling like you may vomit. You have dark pee or pale poop. Get help right away if: You have pain in your abdomen, that: Lasts more than 5 hours. Keeps getting worse. You have a fever or  chills. You can't stop vomiting. Your skin or the white parts of your eyes turn yellow. This information is not intended to replace advice given to you by your health care provider. Make sure you discuss any questions you have with your health care provider. Document Revised: 02/18/2022 Document Reviewed: 02/18/2022 Elsevier Patient Education  2024 [employer].

## 2024-05-24 ENCOUNTER — Other Ambulatory Visit

## 2024-05-27 ENCOUNTER — Other Ambulatory Visit

## 2024-05-28 ENCOUNTER — Ambulatory Visit (LOCAL_COMMUNITY_HEALTH_CENTER)

## 2024-05-28 DIAGNOSIS — Z111 Encounter for screening for respiratory tuberculosis: Secondary | ICD-10-CM

## 2024-05-31 ENCOUNTER — Ambulatory Visit

## 2024-05-31 DIAGNOSIS — Z111 Encounter for screening for respiratory tuberculosis: Secondary | ICD-10-CM

## 2024-05-31 LAB — TB SKIN TEST
Induration: 0 mm
TB Skin Test: NEGATIVE
